# Patient Record
Sex: Female | Born: 1955 | Race: White | Hispanic: No | Marital: Married | Smoking: Never smoker
Health system: Southern US, Community
[De-identification: ages and names within clinical notes are randomized; demographics above are authoritative.]

## PROBLEM LIST (undated history)

## (undated) DIAGNOSIS — I2510 Atherosclerotic heart disease of native coronary artery without angina pectoris: Secondary | ICD-10-CM

## (undated) DIAGNOSIS — Z79890 Hormone replacement therapy (postmenopausal): Secondary | ICD-10-CM

## (undated) DIAGNOSIS — E7800 Pure hypercholesterolemia, unspecified: Secondary | ICD-10-CM

## (undated) DIAGNOSIS — I255 Ischemic cardiomyopathy: Secondary | ICD-10-CM

## (undated) DIAGNOSIS — I10 Essential (primary) hypertension: Secondary | ICD-10-CM

## (undated) DIAGNOSIS — R569 Unspecified convulsions: Secondary | ICD-10-CM

## (undated) DIAGNOSIS — F419 Anxiety disorder, unspecified: Secondary | ICD-10-CM

## (undated) DIAGNOSIS — G454 Transient global amnesia: Secondary | ICD-10-CM

## (undated) DIAGNOSIS — I214 Non-ST elevation (NSTEMI) myocardial infarction: Secondary | ICD-10-CM

## (undated) DIAGNOSIS — N951 Menopausal and female climacteric states: Secondary | ICD-10-CM

## (undated) DIAGNOSIS — K219 Gastro-esophageal reflux disease without esophagitis: Secondary | ICD-10-CM

## (undated) DIAGNOSIS — E669 Obesity, unspecified: Secondary | ICD-10-CM

## (undated) DIAGNOSIS — E785 Hyperlipidemia, unspecified: Secondary | ICD-10-CM

## (undated) HISTORY — DX: Atherosclerotic heart disease of native coronary artery without angina pectoris: I25.10

## (undated) HISTORY — DX: Hyperlipidemia, unspecified: E78.5

## (undated) HISTORY — DX: Hormone replacement therapy: Z79.890

## (undated) HISTORY — DX: Essential (primary) hypertension: I10

## (undated) HISTORY — DX: Anxiety disorder, unspecified: F41.9

## (undated) HISTORY — DX: Ischemic cardiomyopathy: I25.5

## (undated) HISTORY — DX: Gastro-esophageal reflux disease without esophagitis: K21.9

## (undated) HISTORY — DX: Menopausal and female climacteric states: N95.1

## (undated) HISTORY — DX: Unspecified convulsions: R56.9

---

## 1998-11-01 ENCOUNTER — Ambulatory Visit (HOSPITAL_COMMUNITY): Admission: RE | Admit: 1998-11-01 | Discharge: 1998-11-01 | Payer: Self-pay | Admitting: Internal Medicine

## 2014-09-25 ENCOUNTER — Emergency Department
Admission: EM | Admit: 2014-09-25 | Discharge: 2014-09-25 | Disposition: A | Discharge status: Home / Self Care | Payer: BLUE CROSS/BLUE SHIELD | Source: Home / Self Care | Attending: Family Medicine | Admitting: Family Medicine

## 2014-09-25 ENCOUNTER — Encounter: Payer: Self-pay | Admitting: Emergency Medicine

## 2014-09-25 DIAGNOSIS — J209 Acute bronchitis, unspecified: Secondary | ICD-10-CM

## 2014-09-25 HISTORY — DX: Essential (primary) hypertension: I10

## 2014-09-25 MED ORDER — CLARITHROMYCIN 250 MG PO TABS: ORAL_TABLET | ORAL | Status: DC

## 2014-09-25 MED ORDER — BENZONATATE 200 MG PO CAPS: 200.0000 mg | ORAL_CAPSULE | Freq: Every day | ORAL | Status: DC

## 2014-09-25 NOTE — Discharge Instructions (Signed)
Take plain Mucinex (1200 mg guaifenesin) twice daily for cough and congestion.  Increase fluid intake, rest. May use Afrin nasal spray (or generic oxymetazoline) twice daily for about 5 days.  Also recommend using saline nasal spray several times daily and saline nasal irrigation (AYR is a common brand).  Use fluticasone spray after using Afrin spray and saline irrigation. Try warm salt water gargles for sore throat.  Stop all antihistamines for now, and other non-prescription cough/cold preparations.  Follow-up with family doctor if not improving about 7 to10 days.

## 2014-09-25 NOTE — ED Provider Notes (Signed)
CSN: [id number]     Arrival date & time 09/25/14  1754 History   First MD Initiated Contact with Patient 09/25/14 1834     Chief Complaint  Patient presents with  . Cough      HPI Comments: Patient developed a mild cold-like illness about 2.5 weeks ago with sinus congestion, myalgias, and cough.  The cough has persisted and become worse over the past five days, especially at night.  The history is provided by the patient.    Past Medical History  Diagnosis Date  . Hypertension    History reviewed. No pertinent past surgical history. No family history on file. History  Substance Use Topics  . Smoking status: Never Smoker   . Smokeless tobacco: Not on file  . Alcohol Use: Yes   OB History    No data available     Review of Systems No sore throat + cough No pleuritic pain No wheezing + nasal congestion ? post-nasal drainage No sinus pain/pressure No itchy/red eyes No earache No hemoptysis No SOB No fever/chills No nausea No vomiting No abdominal pain No diarrhea No urinary symptoms No skin rash + fatigue No myalgias No headache Used OTC meds without relief  Allergies  Review of patient's allergies indicates no known allergies.  Home Medications   Prior to Admission medications   Medication Sig Start Date End Date Taking? Authorizing Provider  estradiol (VIVELLE-DOT) 0.0375 MG/24HR Place 1 patch onto the skin 2 (two) times a week.   Yes Historical Provider, MD  lisinopril (PRINIVIL,ZESTRIL) 20 MG tablet Take 20 mg by mouth daily.   Yes Historical Provider, MD  progesterone (PROMETRIUM) 100 MG capsule Take 100 mg by mouth daily.   Yes Historical Provider, MD  benzonatate (TESSALON) 200 MG capsule Take 1 capsule (200 mg total) by mouth at bedtime. Take as needed for cough 09/25/14   Lattie Haw, MD  clarithromycin (BIAXIN) 250 MG tablet Take one tab by mouth every 12 hours 09/25/14   Lattie Haw, MD   BP 156/84 mmHg  Pulse 90  Temp(Src) 98.3 F (36.8 C)  (Oral)  Ht  (1.575 m)  Wt 160 lb (72.576 kg)  BMI 29.26 kg/m2  SpO2 99% Physical Exam Nursing notes and Vital Signs reviewed. Appearance:  Patient appears healthy, stated age, and in no acute distress Eyes:  Pupils are equal, round, and reactive to light and accomodation.  Extraocular movement is intact.  Conjunctivae are not inflamed  Ears:  Canals normal.  Tympanic membranes normal.  Nose:  Mildly congested turbinates.  No sinus tenderness   Pharynx:  Normal Neck:  Supple.  No adenopathy Lungs:  Clear to auscultation.  Breath sounds are equal.  Heart:  Regular rate and rhythm without murmurs, rubs, or gallops.  Abdomen:  Nontender without masses or hepatosplenomegaly.  Bowel sounds are present.  No CVA or flank tenderness.  Extremities:  No edema.  No calf tenderness Skin:  No rash present.   ED Course  Procedures  none     MDM   1. Acute bronchitis, unspecified organism; ?pertussis    Begin Biaxin to cover atypicals.  Prescription written for Benzonatate [facility]) to take at bedtime for night-time cough.  Take plain Mucinex (1200 mg guaifenesin) twice daily for cough and congestion.  Increase fluid intake, rest. May use Afrin nasal spray (or generic oxymetazoline) twice daily for about 5 days.  Also recommend using saline nasal spray several times daily and saline nasal irrigation (AYR is a common  brand).  Use fluticasone spray after using Afrin spray and saline irrigation. Try warm salt water gargles for sore throat.  Stop all antihistamines for now, and other non-prescription cough/cold preparations.  Follow-up with family doctor if not improving about 7 to10 days.     Lattie HawStephen A Beese, MD 09/27/14 [phone]

## 2014-09-25 NOTE — ED Notes (Signed)
Productive cough, green mucus, congestion, body aches x 2 weeks

## 2014-11-17 DIAGNOSIS — N951 Menopausal and female climacteric states: Secondary | ICD-10-CM | POA: Insufficient documentation

## 2014-11-17 DIAGNOSIS — J309 Allergic rhinitis, unspecified: Secondary | ICD-10-CM | POA: Insufficient documentation

## 2014-11-17 DIAGNOSIS — K219 Gastro-esophageal reflux disease without esophagitis: Secondary | ICD-10-CM | POA: Insufficient documentation

## 2014-11-17 HISTORY — DX: Menopausal and female climacteric states: N95.1

## 2014-11-17 HISTORY — DX: Gastro-esophageal reflux disease without esophagitis: K21.9

## 2016-07-03 DIAGNOSIS — Z79890 Hormone replacement therapy (postmenopausal): Secondary | ICD-10-CM | POA: Insufficient documentation

## 2016-07-03 HISTORY — DX: Hormone replacement therapy: Z79.890

## 2016-07-10 ENCOUNTER — Inpatient Hospital Stay (HOSPITAL_COMMUNITY)
Admission: EM | Admit: 2016-07-10 | Discharge: 2016-07-11 | DRG: 247 | Disposition: A | Discharge status: Home / Self Care | Payer: BLUE CROSS/BLUE SHIELD | Attending: Cardiology | Admitting: Cardiology

## 2016-07-10 ENCOUNTER — Encounter (HOSPITAL_COMMUNITY): Payer: Self-pay

## 2016-07-10 ENCOUNTER — Encounter (HOSPITAL_COMMUNITY)
Admission: EM | Disposition: A | Discharge status: Home / Self Care | Payer: Self-pay | Source: Home / Self Care | Attending: Cardiology

## 2016-07-10 ENCOUNTER — Emergency Department (HOSPITAL_COMMUNITY): Payer: BLUE CROSS/BLUE SHIELD

## 2016-07-10 DIAGNOSIS — N951 Menopausal and female climacteric states: Secondary | ICD-10-CM | POA: Diagnosis present

## 2016-07-10 DIAGNOSIS — Z8249 Family history of ischemic heart disease and other diseases of the circulatory system: Secondary | ICD-10-CM

## 2016-07-10 DIAGNOSIS — Z955 Presence of coronary angioplasty implant and graft: Secondary | ICD-10-CM

## 2016-07-10 DIAGNOSIS — I2510 Atherosclerotic heart disease of native coronary artery without angina pectoris: Secondary | ICD-10-CM

## 2016-07-10 DIAGNOSIS — Z6830 Body mass index (BMI) 30.0-30.9, adult: Secondary | ICD-10-CM | POA: Diagnosis not present

## 2016-07-10 DIAGNOSIS — E669 Obesity, unspecified: Secondary | ICD-10-CM | POA: Diagnosis present

## 2016-07-10 DIAGNOSIS — R079 Chest pain, unspecified: Secondary | ICD-10-CM | POA: Diagnosis present

## 2016-07-10 DIAGNOSIS — I10 Essential (primary) hypertension: Secondary | ICD-10-CM | POA: Diagnosis present

## 2016-07-10 DIAGNOSIS — I255 Ischemic cardiomyopathy: Secondary | ICD-10-CM | POA: Diagnosis not present

## 2016-07-10 DIAGNOSIS — I214 Non-ST elevation (NSTEMI) myocardial infarction: Principal | ICD-10-CM

## 2016-07-10 DIAGNOSIS — E785 Hyperlipidemia, unspecified: Secondary | ICD-10-CM

## 2016-07-10 HISTORY — DX: Pure hypercholesterolemia, unspecified: E78.00

## 2016-07-10 HISTORY — DX: Atherosclerotic heart disease of native coronary artery without angina pectoris: I25.10

## 2016-07-10 HISTORY — DX: Non-ST elevation (NSTEMI) myocardial infarction: I21.4

## 2016-07-10 HISTORY — DX: Obesity, unspecified: E66.9

## 2016-07-10 HISTORY — DX: Transient global amnesia: G45.4

## 2016-07-10 HISTORY — PX: CARDIAC CATHETERIZATION: SHX172

## 2016-07-10 HISTORY — PX: CORONARY ANGIOPLASTY WITH STENT PLACEMENT: SHX49

## 2016-07-10 HISTORY — DX: Gastro-esophageal reflux disease without esophagitis: K21.9

## 2016-07-10 LAB — CBC
HEMATOCRIT: 44.3 % (ref 36.0–46.0)
HEMOGLOBIN: 15.5 g/dL — AB (ref 12.0–15.0)
MCH: 32.3 pg (ref 26.0–34.0)
MCHC: 35 g/dL (ref 30.0–36.0)
MCV: 92.3 fL (ref 78.0–100.0)
Platelets: 241 10*3/uL (ref 150–400)
RBC: 4.8 MIL/uL (ref 3.87–5.11)
RDW: 12.9 % (ref 11.5–15.5)
WBC: 6 10*3/uL (ref 4.0–10.5)

## 2016-07-10 LAB — TSH: TSH: 2.008 u[IU]/mL (ref 0.350–4.500)

## 2016-07-10 LAB — COMPREHENSIVE METABOLIC PANEL
ALT: 35 U/L (ref 14–54)
AST: 35 U/L (ref 15–41)
Albumin: 4.2 g/dL (ref 3.5–5.0)
Alkaline Phosphatase: 64 U/L (ref 38–126)
Anion gap: 10 (ref 5–15)
BUN: 17 mg/dL (ref 6–20)
CALCIUM: 9.2 mg/dL (ref 8.9–10.3)
CO2: 20 mmol/L — ABNORMAL LOW (ref 22–32)
Chloride: 106 mmol/L (ref 101–111)
Creatinine, Ser: 1.11 mg/dL — ABNORMAL HIGH (ref 0.44–1.00)
GFR calc Af Amer: >60 mL/min (ref >60)
GFR calc non Af Amer: 52 mL/min — ABNORMAL LOW (ref >60)
GLUCOSE: 109 mg/dL — AB (ref 65–99)
Potassium: 4.2 mmol/L (ref 3.5–5.1)
Sodium: 136 mmol/L (ref 135–145)
TOTAL PROTEIN: 6.4 g/dL — AB (ref 6.5–8.1)
Total Bilirubin: 1 mg/dL (ref 0.3–1.2)

## 2016-07-10 LAB — POCT ACTIVATED CLOTTING TIME
Activated Clotting Time: 714 seconds
Activated Clotting Time: 466 seconds

## 2016-07-10 LAB — TROPONIN I
Troponin I: 1.56 ng/mL (ref <0.03)
Troponin I: 1.22 ng/mL (ref <0.03)

## 2016-07-10 LAB — PROTIME-INR
INR: 1.64
PROTHROMBIN TIME: 19.6 s — AB (ref 11.4–15.2)

## 2016-07-10 LAB — I-STAT TROPONIN, ED: Troponin i, poc: 0.49 ng/mL (ref 0.00–0.08)

## 2016-07-10 SURGERY — LEFT HEART CATH AND CORONARY ANGIOGRAPHY

## 2016-07-10 MED ORDER — BIVALIRUDIN BOLUS VIA INFUSION - CUPID
INTRAVENOUS | Status: DC | PRN
  Administered 2016-07-10: 55.125 mg via INTRAVENOUS

## 2016-07-10 MED ORDER — ASPIRIN EC 81 MG PO TBEC: 81.0000 mg | DELAYED_RELEASE_TABLET | Freq: Every day | ORAL | Status: DC

## 2016-07-10 MED ORDER — CARVEDILOL 3.125 MG PO TABS: 3.1250 mg | ORAL_TABLET | Freq: Two times a day (BID) | ORAL | Status: DC

## 2016-07-10 MED ORDER — MIDAZOLAM HCL 2 MG/2ML IJ SOLN
INTRAMUSCULAR | Status: AC
  Filled 2016-07-10: qty 2

## 2016-07-10 MED ORDER — SODIUM CHLORIDE 0.9% FLUSH: 3.0000 mL | Freq: Two times a day (BID) | INTRAVENOUS | Status: DC

## 2016-07-10 MED ORDER — TICAGRELOR 90 MG PO TABS
ORAL_TABLET | ORAL | Status: AC
  Filled 2016-07-10: qty 2

## 2016-07-10 MED ORDER — ONDANSETRON HCL 4 MG/2ML IJ SOLN: 4.0000 mg | Freq: Four times a day (QID) | INTRAMUSCULAR | Status: DC | PRN

## 2016-07-10 MED ORDER — PANTOPRAZOLE SODIUM 40 MG PO TBEC
40.0000 mg | DELAYED_RELEASE_TABLET | Freq: Every day | ORAL | Status: DC
  Administered 2016-07-10 – 2016-07-11 (×2): 40 mg via ORAL
  Filled 2016-07-10 (×2): qty 1

## 2016-07-10 MED ORDER — ONDANSETRON HCL 4 MG/2ML IJ SOLN: 4.0000 mg | Freq: Once | INTRAMUSCULAR | Status: DC

## 2016-07-10 MED ORDER — VERAPAMIL HCL 2.5 MG/ML IV SOLN
INTRAVENOUS | Status: DC | PRN
  Administered 2016-07-10: 10 mL via INTRA_ARTERIAL

## 2016-07-10 MED ORDER — ATORVASTATIN CALCIUM 80 MG PO TABS: 80.0000 mg | ORAL_TABLET | Freq: Every day | ORAL | Status: DC

## 2016-07-10 MED ORDER — NITROGLYCERIN 1 MG/10 ML FOR IR/CATH LAB
INTRA_ARTERIAL | Status: DC | PRN
  Administered 2016-07-10 (×4): 200 ug

## 2016-07-10 MED ORDER — SODIUM CHLORIDE 0.9 % IV SOLN
INTRAVENOUS | Status: DC | PRN
  Administered 2016-07-10: 1.75 mg/kg/h via INTRAVENOUS

## 2016-07-10 MED ORDER — LIDOCAINE HCL (PF) 1 % IJ SOLN
INTRAMUSCULAR | Status: AC
  Filled 2016-07-10: qty 30

## 2016-07-10 MED ORDER — PROGESTERONE MICRONIZED 100 MG PO CAPS
100.0000 mg | ORAL_CAPSULE | Freq: Every day | ORAL | Status: DC
  Administered 2016-07-11: 11:00:00 100 mg via ORAL
  Filled 2016-07-10: qty 1

## 2016-07-10 MED ORDER — HEART ATTACK BOUNCING BOOK
Freq: Once | Status: AC
  Administered 2016-07-10: 20:00:00
  Filled 2016-07-10: qty 1

## 2016-07-10 MED ORDER — NITROGLYCERIN 0.4 MG SL SUBL
SUBLINGUAL_TABLET | SUBLINGUAL | Status: AC
  Administered 2016-07-10: 0.4 mg
  Filled 2016-07-10: qty 1

## 2016-07-10 MED ORDER — TICAGRELOR 90 MG PO TABS
ORAL_TABLET | ORAL | Status: DC | PRN
  Administered 2016-07-10: 180 mg via ORAL

## 2016-07-10 MED ORDER — IOPAMIDOL (ISOVUE-370) INJECTION 76%
INTRAVENOUS | Status: AC
  Filled 2016-07-10: qty 100

## 2016-07-10 MED ORDER — CARVEDILOL 3.125 MG PO TABS
3.1250 mg | ORAL_TABLET | Freq: Two times a day (BID) | ORAL | Status: DC
  Administered 2016-07-10 – 2016-07-11 (×2): 3.125 mg via ORAL
  Filled 2016-07-10 (×2): qty 1

## 2016-07-10 MED ORDER — ANGIOPLASTY BOOK
Freq: Once | Status: AC
  Administered 2016-07-10: 20:00:00
  Filled 2016-07-10: qty 1

## 2016-07-10 MED ORDER — ATROPINE SULFATE 1 MG/10ML IJ SOSY
PREFILLED_SYRINGE | INTRAMUSCULAR | Status: AC
  Filled 2016-07-10: qty 10

## 2016-07-10 MED ORDER — BIVALIRUDIN 250 MG IV SOLR
INTRAVENOUS | Status: AC
  Filled 2016-07-10: qty 250

## 2016-07-10 MED ORDER — ASPIRIN EC 81 MG PO TBEC
81.0000 mg | DELAYED_RELEASE_TABLET | Freq: Every day | ORAL | Status: DC
  Administered 2016-07-11: 09:00:00 81 mg via ORAL
  Filled 2016-07-10: qty 1

## 2016-07-10 MED ORDER — ATORVASTATIN CALCIUM 80 MG PO TABS
80.0000 mg | ORAL_TABLET | Freq: Every day | ORAL | Status: DC
  Administered 2016-07-10: 80 mg via ORAL
  Filled 2016-07-10: qty 1

## 2016-07-10 MED ORDER — ACETAMINOPHEN 325 MG PO TABS: 650.0000 mg | ORAL_TABLET | ORAL | Status: DC | PRN

## 2016-07-10 MED ORDER — HEPARIN SODIUM (PORCINE) 1000 UNIT/ML IJ SOLN
INTRAMUSCULAR | Status: DC | PRN
  Administered 2016-07-10: 4000 [IU] via INTRAVENOUS

## 2016-07-10 MED ORDER — NITROGLYCERIN 0.4 MG SL SUBL: 0.4000 mg | SUBLINGUAL_TABLET | SUBLINGUAL | Status: DC | PRN

## 2016-07-10 MED ORDER — FENTANYL CITRATE (PF) 100 MCG/2ML IJ SOLN
INTRAMUSCULAR | Status: DC | PRN
  Administered 2016-07-10 (×4): 25 ug via INTRAVENOUS

## 2016-07-10 MED ORDER — ASPIRIN 300 MG RE SUPP: 300.0000 mg | RECTAL | Status: AC

## 2016-07-10 MED ORDER — TICAGRELOR 90 MG PO TABS
90.0000 mg | ORAL_TABLET | Freq: Two times a day (BID) | ORAL | Status: DC
  Administered 2016-07-11 (×2): 90 mg via ORAL
  Filled 2016-07-10 (×2): qty 1

## 2016-07-10 MED ORDER — HEPARIN (PORCINE) IN NACL 100-0.45 UNIT/ML-% IJ SOLN
800.0000 [IU]/h | INTRAMUSCULAR | Status: DC
  Administered 2016-07-10: 800 [IU]/h via INTRAVENOUS
  Filled 2016-07-10: qty 250

## 2016-07-10 MED ORDER — SODIUM CHLORIDE 0.9 % WEIGHT BASED INFUSION: 1.0000 mL/kg/h | INTRAVENOUS | Status: AC

## 2016-07-10 MED ORDER — HEPARIN BOLUS VIA INFUSION
4000.0000 [IU] | Freq: Once | INTRAVENOUS | Status: AC
  Administered 2016-07-10: 4000 [IU] via INTRAVENOUS
  Filled 2016-07-10: qty 4000

## 2016-07-10 MED ORDER — LISINOPRIL 10 MG PO TABS
20.0000 mg | ORAL_TABLET | Freq: Every day | ORAL | Status: DC
  Administered 2016-07-11: 09:00:00 20 mg via ORAL
  Filled 2016-07-10: qty 2

## 2016-07-10 MED ORDER — SODIUM CHLORIDE 0.9 % IV SOLN: 250.0000 mL | INTRAVENOUS | Status: DC | PRN

## 2016-07-10 MED ORDER — VERAPAMIL HCL 2.5 MG/ML IV SOLN
INTRAVENOUS | Status: AC
  Filled 2016-07-10: qty 2

## 2016-07-10 MED ORDER — HYDRALAZINE HCL 20 MG/ML IJ SOLN
10.0000 mg | INTRAMUSCULAR | Status: DC | PRN
  Administered 2016-07-10: 20:00:00 10 mg via INTRAVENOUS
  Filled 2016-07-10: qty 1

## 2016-07-10 MED ORDER — HEPARIN (PORCINE) IN NACL 100-0.45 UNIT/ML-% IJ SOLN: 14.0000 [IU]/kg/h | Freq: Once | INTRAMUSCULAR | Status: DC

## 2016-07-10 MED ORDER — HEPARIN SODIUM (PORCINE) 5000 UNIT/ML IJ SOLN: 4000.0000 [IU] | Freq: Once | INTRAMUSCULAR | Status: DC

## 2016-07-10 MED ORDER — MIDAZOLAM HCL 2 MG/2ML IJ SOLN
INTRAMUSCULAR | Status: DC | PRN
  Administered 2016-07-10 (×2): 1 mg via INTRAVENOUS
  Administered 2016-07-10: 2 mg via INTRAVENOUS
  Administered 2016-07-10: 1 mg via INTRAVENOUS

## 2016-07-10 MED ORDER — HEPARIN SODIUM (PORCINE) 1000 UNIT/ML IJ SOLN
INTRAMUSCULAR | Status: AC
  Filled 2016-07-10: qty 1

## 2016-07-10 MED ORDER — VENLAFAXINE HCL ER 75 MG PO CP24
75.0000 mg | ORAL_CAPSULE | Freq: Every day | ORAL | Status: DC
  Administered 2016-07-11: 11:00:00 75 mg via ORAL
  Filled 2016-07-10: qty 1

## 2016-07-10 MED ORDER — LIDOCAINE HCL (PF) 1 % IJ SOLN
INTRAMUSCULAR | Status: DC | PRN
  Administered 2016-07-10: 2 mL

## 2016-07-10 MED ORDER — NITROGLYCERIN 1 MG/10 ML FOR IR/CATH LAB
INTRA_ARTERIAL | Status: AC
  Filled 2016-07-10: qty 10

## 2016-07-10 MED ORDER — ASPIRIN 81 MG PO CHEW: 324.0000 mg | CHEWABLE_TABLET | ORAL | Status: AC

## 2016-07-10 MED ORDER — FENTANYL CITRATE (PF) 100 MCG/2ML IJ SOLN
INTRAMUSCULAR | Status: AC
  Filled 2016-07-10: qty 2

## 2016-07-10 MED ORDER — ASPIRIN 81 MG PO CHEW: 81.0000 mg | CHEWABLE_TABLET | Freq: Every day | ORAL | Status: DC

## 2016-07-10 MED ORDER — HEPARIN (PORCINE) IN NACL 2-0.9 UNIT/ML-% IJ SOLN
INTRAMUSCULAR | Status: DC | PRN
  Administered 2016-07-10: 1500 mL

## 2016-07-10 MED ORDER — HEPARIN (PORCINE) IN NACL 2-0.9 UNIT/ML-% IJ SOLN
INTRAMUSCULAR | Status: AC
  Filled 2016-07-10: qty 1000

## 2016-07-10 MED ORDER — MORPHINE SULFATE (PF) 4 MG/ML IV SOLN: 4.0000 mg | Freq: Once | INTRAVENOUS | Status: DC

## 2016-07-10 MED ORDER — SODIUM CHLORIDE 0.9% FLUSH: 3.0000 mL | INTRAVENOUS | Status: DC | PRN

## 2016-07-10 MED ORDER — SODIUM CHLORIDE 0.9% FLUSH
3.0000 mL | Freq: Two times a day (BID) | INTRAVENOUS | Status: DC
  Administered 2016-07-10 – 2016-07-11 (×2): 3 mL via INTRAVENOUS

## 2016-07-10 MED ORDER — ONDANSETRON HCL 4 MG/2ML IJ SOLN
4.0000 mg | Freq: Four times a day (QID) | INTRAMUSCULAR | Status: DC | PRN
  Administered 2016-07-10: 4 mg via INTRAVENOUS
  Filled 2016-07-10: qty 2

## 2016-07-10 SURGICAL SUPPLY — 26 items
BALLN EMERGE MR 2.25X20 (BALLOONS) ×3
BALLN ~~LOC~~ EMERGE MR 3.0X8 (BALLOONS) ×3
BALLN ~~LOC~~ TREK RX 2.75X15 (BALLOONS) ×3
BALLOON EMERGE MR 2.25X20 (BALLOONS) ×1 IMPLANT
BALLOON ~~LOC~~ EMERGE MR 3.0X8 (BALLOONS) ×1 IMPLANT
BALLOON ~~LOC~~ TREK RX 2.75X15 (BALLOONS) ×1 IMPLANT
CATH 5FR JL3.5 JR4 ANG PIG MP (CATHETERS) ×3 IMPLANT
CATH INFINITI 5 FR 3DRC (CATHETERS) ×3 IMPLANT
CATH VISTA GUIDE 6FR JR4 (CATHETERS) ×3 IMPLANT
DEVICE RAD COMP TR BAND LRG (VASCULAR PRODUCTS) ×3 IMPLANT
GLIDESHEATH SLEND SS 6F .021 (SHEATH) ×3 IMPLANT
GUIDE CATH RUNWAY 6FR CLS3.5 (CATHETERS) ×3 IMPLANT
KIT ENCORE 26 ADVANTAGE (KITS) ×3 IMPLANT
KIT HEART LEFT (KITS) ×3 IMPLANT
PACK CARDIAC CATHETERIZATION (CUSTOM PROCEDURE TRAY) ×3 IMPLANT
SHEATH PINNACLE 6F 10CM (SHEATH) ×3 IMPLANT
STENT PROMUS PREM MR 2.5X32 (Permanent Stent) ×3 IMPLANT
STENT PROMUS PREM MR 2.5X38 (Permanent Stent) ×3 IMPLANT
STENT PROMUS PREM MR 2.75X12 (Permanent Stent) ×3 IMPLANT
SYR MEDRAD MARK V 150ML (SYRINGE) ×3 IMPLANT
TRANSDUCER W/STOPCOCK (MISCELLANEOUS) ×3 IMPLANT
TUBING CIL FLEX 10 FLL-RA (TUBING) ×3 IMPLANT
VALVE GUARDIAN II ~~LOC~~ HEMO (MISCELLANEOUS) ×3 IMPLANT
WIRE ASAHI PROWATER 180CM (WIRE) ×6 IMPLANT
WIRE EMERALD 3MM-J .035X150CM (WIRE) ×3 IMPLANT
WIRE HI TORQ VERSACORE-J 145CM (WIRE) ×3 IMPLANT

## 2016-07-10 NOTE — ED Notes (Signed)
Pt now states she is pain free.

## 2016-07-10 NOTE — Interval H&P Note (Signed)
Cath Lab Visit (complete for each Cath Lab visit)  Clinical Evaluation Leading to the Procedure:   ACS: Yes.    Non-ACS:    Anginal Classification: CCS IV  Anti-ischemic medical therapy: Minimal Therapy (1 class of medications)  Non-Invasive Test Results: No non-invasive testing performed  Prior CABG: No previous CABG      History and Physical Interval Note:  07/10/2016 2:26 PM  Amanda Dawson  has presented today for surgery, with the diagnosis of positive trip  The various methods of treatment have been discussed with the patient and family. After consideration of risks, benefits and other options for treatment, the patient has consented to  Procedure(s): Left Heart Cath and Coronary Angiography (N/A) as a surgical intervention .  The patient's history has been reviewed, patient examined, no change in status, stable for surgery.  I have reviewed the patient's chart and labs.  Questions were answered to the patient's satisfaction.     Lance MussJayadeep George Haggart

## 2016-07-10 NOTE — ED Triage Notes (Signed)
Pt arrives [facility] from work where she had onset of chest pain while walking. CO SHOB and clammy but non radiating central chest pain that made her sit down. Chest pain now resolved. HX of HTN. 324 mg Aspirin by EMS PTA.

## 2016-07-10 NOTE — ED Notes (Signed)
Placed patient into a gown on the monitor did ekg shown to Dr Deretha EmoryZackowski

## 2016-07-10 NOTE — ED Notes (Signed)
Requested tio stop heparin before going to cath lab.

## 2016-07-10 NOTE — H&P (Signed)
Patient ID: Amanda Dawson MRN: [id number], DOB/AGE: [birth year]   Admit date: 07/10/2016   Primary Physician: [name]., MD Primary Cardiologist: New - Dr. Delton See Reason for admission: chest pain   Pt. Profile:  Amanda Dawson is a 60 y.o. female with a history of obesity, HTN and no prior cardiac history who presented to [facility] today with chest pain.  She was in her usual state of health until this morning while at work when she had sudden onset of chest pain and shortness of breath and diaphoresis that lasted minutes. She went to see their on-call physician's assistant and EMS was called. She was walking when the chest pain occurred and she sat down due to the pain. It did resolve on its own. When she got to the ER she did have return of the chest pain which resolved completely with one sublingual nitroglycerin. She is now chest pain-free.  She only takes lisinopril for hypertension as well as progesterone, Effexor and estradiol for hot flashes. She has been slowly tapering down on estrogen. She recently had some vaginal bleeding that endometrial biopsy was fortunately benign.  She has no history of diabetes or hyperlipidemia and follows with a PCP regularly. No family history of heart disease. She is a never smoker. She does not formally exercise but is very active around the house gardening ect. No decrease exercise tolerance.   Problem List  Past Medical History:  Diagnosis Date  . Hypertension   . Obesity     History reviewed. No pertinent surgical history.   Allergies  No Known Allergies   Home Medications  Prior to Admission medications   Medication Sig Start Date End Date Taking? Authorizing Provider  calcium citrate-vitamin D (CITRACAL+D) 315-200 MG-UNIT tablet Take 1 tablet by mouth daily.   Yes Historical Provider, MD  Cholecalciferol (VITAMIN D3) 5000 units TABS Take 1 capsule by mouth daily.   Yes Historical Provider, MD  estradiol  (CLIMARA - DOSED IN MG/24 HR) 0.1 mg/24hr patch Place 1 patch onto the skin once a week. 11/10/15  Yes Historical Provider, MD  lisinopril (PRINIVIL,ZESTRIL) 20 MG tablet Take 20 mg by mouth daily.   Yes Historical Provider, MD  naproxen sodium (ANAPROX) 220 MG tablet Take 220 mg by mouth 2 (two) times daily as needed (pain).   Yes Historical Provider, MD  omeprazole (PRILOSEC) 20 MG capsule Take 20 mg by mouth daily. 12/24/15  Yes Historical Provider, MD  progesterone (PROMETRIUM) 100 MG capsule Take 100 mg by mouth daily.   Yes Historical Provider, MD  venlafaxine XR (EFFEXOR-XR) 75 MG 24 hr capsule Take 75 mg by mouth daily. 02/12/15  Yes Historical Provider, MD  benzonatate (TESSALON) 200 MG capsule Take 1 capsule (200 mg total) by mouth at bedtime. Take as needed for cough Patient not taking: Reported on 07/10/2016 09/25/14   Lattie Haw, MD  clarithromycin Quita Skye) 250 MG tablet Take one tab by mouth every 12 hours Patient not taking: Reported on 07/10/2016 09/25/14   Lattie Haw, MD    Family History  Family History  Problem Relation Age of Onset  . Hypertension Mother   . Heart disease Brother     Not sure what   Family Status  Relation Status  . Mother   . Brother      Social History  Social History   Social History  . Marital status: Married    Spouse name: N/A  . Number of children: N/A  .  Years of education: N/A   Occupational History  . Not on file.   Social History Main Topics  . Smoking status: Never Smoker  . Smokeless tobacco: Never Used  . Alcohol use Yes  . Drug use: No  . Sexual activity: Not on file   Other Topics Concern  . Not on file   Social History Narrative  . No narrative on file     Review of Systems General:  No chills, fever, night sweats or weight changes.  Cardiovascular:  +++chest pain, +++ dyspnea on exertion, No edema, orthopnea, palpitations, paroxysmal nocturnal dyspnea. Dermatological: No rash,  lesions/masses Respiratory: No cough, dyspnea Urologic: No hematuria, dysuria Abdominal:   No nausea, vomiting, diarrhea, bright red blood per rectum, melena, or hematemesis Neurologic:  No visual changes, wkns, changes in mental status. All other systems reviewed and are otherwise negative except as noted above.  Physical Exam  Blood pressure 142/75, pulse 100, temperature 98.3 F (36.8 C), temperature source Oral, resp. rate 19, height 5\' 1"  (1.549 m), weight 162 lb (73.5 kg), SpO2 96 %.  General: Pleasant, NAD, obese Psych: Normal affect. Neuro: Alert and oriented X 3. Moves all extremities spontaneously. HEENT: Normal  Neck: Supple without bruits or JVD. Lungs:  Resp regular and unlabored, CTA. Heart: RRR no s3, s4, or murmurs. Abdomen: Soft, non-tender, non-distended, BS + x 4.  Extremities: No clubbing, cyanosis or edema. DP/PT/Radials 2+ and equal bilaterally.  Labs  No results for input(s): CKTOTAL, CKMB, TROPONINI in the last 72 hours. Lab Results  Component Value Date   WBC 6.0 07/10/2016   HGB 15.5 (H) 07/10/2016   HCT 44.3 07/10/2016   MCV 92.3 07/10/2016   PLT 241 07/10/2016     Recent Labs Lab 07/10/16 1030  NA 136  K 4.2  CL 106  CO2 20*  BUN 17  CREATININE 1.11*  CALCIUM 9.2  PROT 6.4*  BILITOT 1.0  ALKPHOS 64  ALT 35  AST 35  GLUCOSE 109*   No results found for: CHOL, HDL, LDLCALC, TRIG No results found for: DDIMER   Radiology/Studies  Dg Chest 2 View  Result Date: 07/10/2016 CLINICAL DATA:  Chest pain. EXAM: CHEST  2 VIEW COMPARISON:  No recent prior . FINDINGS: Mediastinum and hilar structures normal. Low lung volumes. No pleural effusion or pneumothorax. Borderline cardiomegaly with normal pulmonary vascularity. No acute bony abnormality . IMPRESSION: Low lung volumes. Borderline cardiomegaly. No pulmonary venous congestion . Electronically Signed   By: Maisie Fus  Register   On: 07/10/2016 13:30   ECG  NSR with non specific anterolateral  ST/TW changes    ASSESSMENT AND PLAN  NSTEMI: mildly elevated POC troponin and ECG with non specific anterolateral ST/TW changes. Will plan for LHC today. Continue IV heparin. Now chest pain free. Will add BB and statin.   I have reviewed the risks, indications, and alternatives to cardiac catheterization and possible angioplasty/stenting with the patient. Risks include but are not limited to bleeding, infection, vascular injury, stroke, myocardial infection, arrhythmia, kidney injury, radiation-related injury in the case of prolonged fluoroscopy use, emergency cardiac surgery, and death. The patient understands the risks of serious complication is low (<1%).   HTN: continue home lisinopril. Will add Coreg 3.25mg  BID.   Obesity: Body mass index is 30.61 kg/m.  Signed, Cline Crock, PA-C 07/10/2016, 1:50 PM  Pager [phone]  The patient was seen, examined and discussed with Deborha Payment, PA-C and I agree with the above.   A very pleasant 60 year old  female with h/o obesity, who experienced typical chest pain while walking earlier today associated with SOB and diaphoresis, resolved at rest, ECG suspicious for anterior ischemia, the first troponin 0.49. No FH od early CAD, no prior smoking, no h/o DM. We will schedule a left cardiac cath today, crea is normal. She and her husband agree.Start aspirin, atorvastatin, carvedilol 3.125 mg po BID, lisinopril 2.5 mg po daily after the cath.   Tobias AlexanderKatarina Sriman Tally, MD  07/10/2016

## 2016-07-10 NOTE — ED Notes (Signed)
Pt returns from xray and ambulates to BR.

## 2016-07-10 NOTE — Progress Notes (Signed)
Site area: right groin  Site Prior to Removal:  Level 0  Pressure Applied For 20 MINUTES    Minutes Beginning at 20:25  Manual:   Yes.    Patient Status During Pull:  WNL  Post Pull Groin Site:  Level 0  Post Pull Instructions Given:  Yes.    Post Pull Pulses Present:  Yes.    Dressing Applied:  Yes.    Comments:

## 2016-07-10 NOTE — ED Notes (Signed)
Pt states chest pain 1/10 at left chest. PA aware

## 2016-07-10 NOTE — ED Notes (Signed)
Patient transported to X-ray 

## 2016-07-10 NOTE — Progress Notes (Signed)
TR BAND REMOVAL  LOCATION:    right radial  DEFLATED PER PROTOCOL:    Yes.    TIME BAND OFF / DRESSING APPLIED:    20:45   SITE UPON ARRIVAL:    Level 1; small palpable hematoma  SITE AFTER BAND REMOVAL:    Level 1; Bruise  CIRCULATION SENSATION AND MOVEMENT:    Within Normal Limits   Yes.    COMMENTS:   Post TR band instructions given. Pt tolerated well.

## 2016-07-10 NOTE — Progress Notes (Signed)
ANTICOAGULATION CONSULT NOTE - Initial Consult  Pharmacy Consult for heparin Indication: chest pain/ACS  No Known Allergies  Patient Measurements: Height: 5\' 1"  (154.9 cm) Weight: 162 lb (73.5 kg) IBW/kg (Calculated) : 47.8 Heparin Dosing Weight: 64kg  Vital Signs: Temp: 98.3 F (36.8 C) (10/23 1021) Temp Source: Oral (10/23 1021) BP: 167/81 (10/23 1100) Pulse Rate: 70 (10/23 1100)  Labs:  Recent Labs  07/10/16 1030  HGB 15.5*  HCT 44.3  PLT 241  CREATININE 1.11*    Estimated Creatinine Clearance: 48.2 mL/min (by C-G formula based on SCr of 1.11 mg/dL (H)).   Medical History: Past Medical History:  Diagnosis Date  . Hypertension     Medications:  Infusions:  . heparin      Assessment: Amanda Dawson yof presented to the ED with CP. Troponin found to be elevated and now starting IV heparin. Baseline CBC is WNL and she is not on anticoagulation PTA.   Goal of Therapy:  Heparin level 0.3-0.7 units/ml Monitor platelets by anticoagulation protocol: Yes   Plan:  - Heparin bolus 4000 units IV x 1 - Heparin gtt 800 units/hr - Check a 6 hr heparin level - Daily heparin level and CBC  Chrysta Fulcher, Drake Leachachel Lynn 07/10/2016,11:35 AM

## 2016-07-10 NOTE — ED Provider Notes (Signed)
[facility]-EMERGENCY DEPT Provider Note   CSN: [id number] Arrival date & time: 07/10/16  1018     History   Chief Complaint Chief Complaint  Patient presents with  . Chest Pain    HPI Amanda Dawson is a 60 y.o. female with history of hypertension who presents with sudden onset chest pressure, shortness of breath and diaphoresis that began this morning while patient was walking at work. Patient denies any radiation or pleuritic symptoms. Patient denies any nausea, vomiting, abdominal pain. Patient does not have any diabetes, history of stroke, high cholesterol, smoking. Patient was given 4 aspirin by EMS en route. Patient has been seeing her OB/GYN for hormone replacement. Patient has an estrogen patch. Patient has been having some vaginal bleeding, but recently had a negative ultrasound. Patient has no 1st degree family history of cardiac problems. Patient denies any recent long trips, surgery, cancer, new leg pain or swelling.Patient rates her pain as a 1/10.  HPI  Past Medical History:  Diagnosis Date  . Hypertension   . Obesity     Patient Active Problem List   Diagnosis Date Noted  . NSTEMI (non-ST elevated myocardial infarction) (HCC) 07/10/2016  . Hypertension     History reviewed. No pertinent surgical history.  OB History    Gravida Para Term Preterm AB Living   1 1           SAB TAB Ectopic Multiple Live Births                   Home Medications    Prior to Admission medications   Medication Sig Start Date End Date Taking? Authorizing Provider  calcium citrate-vitamin D (CITRACAL+D) 315-200 MG-UNIT tablet Take 1 tablet by mouth daily.   Yes Historical Provider, MD  Cholecalciferol (VITAMIN D3) 5000 units TABS Take 1 capsule by mouth daily.   Yes Historical Provider, MD  estradiol (CLIMARA - DOSED IN MG/24 HR) 0.1 mg/24hr patch Place 1 patch onto the skin once a week. 11/10/15  Yes Historical Provider, MD  lisinopril (PRINIVIL,ZESTRIL) 20 MG tablet Take 20 mg by  mouth daily.   Yes Historical Provider, MD  naproxen sodium (ANAPROX) 220 MG tablet Take 220 mg by mouth 2 (two) times daily as needed (pain).   Yes Historical Provider, MD  omeprazole (PRILOSEC) 20 MG capsule Take 20 mg by mouth daily. 12/24/15  Yes Historical Provider, MD  progesterone (PROMETRIUM) 100 MG capsule Take 100 mg by mouth daily.   Yes Historical Provider, MD  venlafaxine XR (EFFEXOR-XR) 75 MG 24 hr capsule Take 75 mg by mouth daily. 02/12/15  Yes Historical Provider, MD  benzonatate (TESSALON) 200 MG capsule Take 1 capsule (200 mg total) by mouth at bedtime. Take as needed for cough Patient not taking: Reported on 07/10/2016 09/25/14   Lattie HawStephen A Beese, MD  clarithromycin [place](BIAXIN) 250 MG tablet Take one tab by mouth every 12 hours Patient not taking: Reported on 07/10/2016 09/25/14   Lattie HawStephen A Beese, MD    Family History Family History  Problem Relation Age of Onset  . Hypertension Mother   . Heart disease Brother     Not sure what    Social History Social History  Substance Use Topics  . Smoking status: Never Smoker  . Smokeless tobacco: Never Used  . Alcohol use Yes     Allergies   Review of patient's allergies indicates no known allergies.   Review of Systems Review of Systems  Constitutional: Positive for diaphoresis. Negative for chills  and fever.  HENT: Negative for facial swelling and sore throat.   Respiratory: Positive for shortness of breath.   Cardiovascular: Positive for chest pain.  Gastrointestinal: Negative for abdominal pain, nausea and vomiting.  Genitourinary: Negative for dysuria.  Musculoskeletal: Negative for back pain.  Skin: Negative for rash and wound.  Neurological: Negative for headaches.  Psychiatric/Behavioral: The patient is not nervous/anxious.      Physical Exam Updated Vital Signs BP 165/95   Pulse 78   Temp 98.3 F (36.8 C) (Oral)   Resp 21   Ht 5\' 1"  (1.549 m)   Wt 73.5 kg   SpO2 100%   BMI 30.61 kg/m   Physical Exam    Constitutional: She appears well-developed and well-nourished. No distress.  HENT:  Head: Normocephalic and atraumatic.  Mouth/Throat: Oropharynx is clear and moist. No oropharyngeal exudate.  Eyes: Conjunctivae are normal. Pupils are equal, round, and reactive to light. Right eye exhibits no discharge. Left eye exhibits no discharge. No scleral icterus.  Neck: Normal range of motion. Neck supple. No thyromegaly present.  Cardiovascular: Normal rate, regular rhythm, normal heart sounds and intact distal pulses.  Exam reveals no gallop and no friction rub.   No murmur heard. Pulmonary/Chest: Effort normal and breath sounds normal. No stridor. No respiratory distress. She has no wheezes. She has no rales.  Abdominal: Soft. Bowel sounds are normal. She exhibits no distension. There is no tenderness. There is no rebound and no guarding.  Musculoskeletal: She exhibits no edema.  No calf TTP bilaterally  Lymphadenopathy:    She has no cervical adenopathy.  Neurological: She is alert. Coordination normal.  Skin: Skin is warm and dry. No rash noted. She is not diaphoretic. No pallor.  Psychiatric: She has a normal mood and affect.  Nursing note and vitals reviewed.    ED Treatments / Results  Labs (all labs ordered are listed, but only abnormal results are displayed) Labs Reviewed  CBC - Abnormal; Notable for the following:       Result Value   Hemoglobin 15.5 (*)    All other components within normal limits  COMPREHENSIVE METABOLIC PANEL - Abnormal; Notable for the following:    CO2 20 (*)    Glucose, Bld 109 (*)    Creatinine, Ser 1.11 (*)    Total Protein 6.4 (*)    GFR calc non Af Amer 52 (*)    All other components within normal limits  I-STAT TROPOININ, ED - Abnormal; Notable for the following:    Troponin i, poc 0.49 (*)    All other components within normal limits  HEPARIN LEVEL (UNFRACTIONATED)  PROTIME-INR  I-STAT TROPOININ, ED    EKG  EKG  Interpretation  Date/Time:  Monday July 10 2016 10:19:42 EDT Ventricular Rate:  69 PR Interval:    QRS Duration: 98 QT Interval:  392 QTC Calculation: 420 R Axis:   35 Text Interpretation:  Sinus rhythm Abnormal T, consider ischemia, lateral leads No previous ECGs available Confirmed by ZACKOWSKI  MD, SCOTT [phone]) on 07/10/2016 10:30:11 AM       Radiology Dg Chest 2 View  Result Date: 07/10/2016 CLINICAL DATA:  Chest pain. EXAM: CHEST  2 VIEW COMPARISON:  No recent prior . FINDINGS: Mediastinum and hilar structures normal. Low lung volumes. No pleural effusion or pneumothorax. Borderline cardiomegaly with normal pulmonary vascularity. No acute bony abnormality . IMPRESSION: Low lung volumes. Borderline cardiomegaly. No pulmonary venous congestion . Electronically Signed   By: Maisie Fus  Register  On: 07/10/2016 13:30    Procedures Procedures (including critical care time)  CRITICAL CARE Performed by: Emi Holes   Total critical care time: 30 minutes  Critical care time was exclusive of separately billable procedures and treating other patients.  Critical care was necessary to treat or prevent imminent or life-threatening deterioration.  Critical care was time spent personally by me on the following activities: development of treatment plan with patient and/or surrogate as well as nursing, discussions with consultants, evaluation of patient's response to treatment, examination of patient, obtaining history from patient or surrogate, ordering and performing treatments and interventions, ordering and review of laboratory studies, ordering and review of radiographic studies, pulse oximetry and re-evaluation of patient's condition.   Medications Ordered in ED Medications  heparin ADULT infusion 100 units/mL (25000 units/263mL sodium chloride 0.45%) (0 Units/hr Intravenous Stopped 07/10/16 1410)  aspirin EC tablet 81 mg ( Oral Automatically Held 07/18/16 1000)  atorvastatin  (LIPITOR) tablet 80 mg ( Oral Automatically Held 07/18/16 1800)  carvedilol (COREG) tablet 3.125 mg ( Oral Automatically Held 07/18/16 1700)  fentaNYL (SUBLIMAZE) injection (25 mcg Intravenous Given 07/10/16 1507)  midazolam (VERSED) injection (1 mg Intravenous Given 07/10/16 1508)  heparin injection (4,000 Units Intravenous Given 07/10/16 1448)  lidocaine (PF) (XYLOCAINE) 1 % injection (2 mLs  Given 07/10/16 1448)  Radial Cocktail/Verapamil only (10 mLs Intra-arterial Given 07/10/16 1448)  bivalirudin (ANGIOMAX) BOLUS via infusion (55.125 mg Intravenous Given 07/10/16 1512)  bivalirudin (ANGIOMAX) 250 mg in sodium chloride 0.9 % 50 mL (5 mg/mL) infusion (1.75 mg/kg/hr  73.5 kg Intravenous New Bag/Given 07/10/16 1514)  nitroGLYCERIN (NITROSTAT) 0.4 MG SL tablet (0.4 mg  Given 07/10/16 1123)  heparin bolus via infusion 4,000 Units (4,000 Units Intravenous Bolus from Bag 07/10/16 1250)     Initial Impression / Assessment and Plan / ED Course  I have reviewed the triage vital signs and the nursing notes.  Pertinent labs & imaging results that were available during my care of the patient were reviewed by me and considered in my medical decision making (see chart for details).  Clinical Course   Pain resolved with nitroglycerin glycerin.  EKG shows NSR; abnormal T waves. CBC shows hemoglobin 15.5. CMP shows CO2 20, glucose 109, creatinine 1.11, protein 6.4. Troponin 0.49. CXR shows low lung volumes; borderline cardiomegaly; no pulmonary venous congestion. Patient with ACS syndrome. I consulted cardiology and patient is being taken to cardiac Cath Lab. Patient will be admitted to the hospital through cardiology. Heparin initiated in the ED. Patient also evaluated by Dr. Deretha Emory who guided the patient's management and agrees with plan. Patient vitals stable throughout ED course.  Final Clinical Impressions(s) / ED Diagnoses   Final diagnoses:  NSTEMI (non-ST elevated myocardial infarction)  [facility])    New Prescriptions Current Discharge Medication List       Emi Holes, PA-C 07/10/16 1519    Vanetta Mulders, MD 07/10/16 1606

## 2016-07-11 ENCOUNTER — Inpatient Hospital Stay (HOSPITAL_COMMUNITY): Payer: BLUE CROSS/BLUE SHIELD

## 2016-07-11 ENCOUNTER — Other Ambulatory Visit: Payer: Self-pay | Admitting: *Deleted

## 2016-07-11 ENCOUNTER — Encounter (HOSPITAL_COMMUNITY): Payer: Self-pay | Admitting: Interventional Cardiology

## 2016-07-11 DIAGNOSIS — I2510 Atherosclerotic heart disease of native coronary artery without angina pectoris: Secondary | ICD-10-CM

## 2016-07-11 DIAGNOSIS — I255 Ischemic cardiomyopathy: Secondary | ICD-10-CM

## 2016-07-11 DIAGNOSIS — Z955 Presence of coronary angioplasty implant and graft: Secondary | ICD-10-CM

## 2016-07-11 DIAGNOSIS — E785 Hyperlipidemia, unspecified: Secondary | ICD-10-CM

## 2016-07-11 HISTORY — DX: Hyperlipidemia, unspecified: E78.5

## 2016-07-11 HISTORY — DX: Atherosclerotic heart disease of native coronary artery without angina pectoris: I25.10

## 2016-07-11 LAB — COMPREHENSIVE METABOLIC PANEL
ALBUMIN: 4.1 g/dL (ref 3.5–5.0)
ALT: 35 U/L (ref 14–54)
ANION GAP: 10 (ref 5–15)
AST: 45 U/L — ABNORMAL HIGH (ref 15–41)
Alkaline Phosphatase: 65 U/L (ref 38–126)
BUN: 9 mg/dL (ref 6–20)
CO2: 26 mmol/L (ref 22–32)
Calcium: 9.2 mg/dL (ref 8.9–10.3)
Chloride: 100 mmol/L — ABNORMAL LOW (ref 101–111)
Creatinine, Ser: 0.99 mg/dL (ref 0.44–1.00)
GFR calc Af Amer: >60 mL/min (ref >60)
GFR calc non Af Amer: 60 mL/min — ABNORMAL LOW (ref >60)
GLUCOSE: 111 mg/dL — AB (ref 65–99)
POTASSIUM: 4.1 mmol/L (ref 3.5–5.1)
SODIUM: 136 mmol/L (ref 135–145)
Total Bilirubin: 1.8 mg/dL — ABNORMAL HIGH (ref 0.3–1.2)
Total Protein: 6.9 g/dL (ref 6.5–8.1)

## 2016-07-11 LAB — LIPID PANEL
Cholesterol: 190 mg/dL (ref 0–200)
HDL: 38 mg/dL — ABNORMAL LOW (ref >40)
LDL CALC: 105 mg/dL — AB (ref 0–99)
Total CHOL/HDL Ratio: 5 RATIO
Triglycerides: 236 mg/dL — ABNORMAL HIGH (ref <150)
VLDL: 47 mg/dL — ABNORMAL HIGH (ref 0–40)

## 2016-07-11 LAB — CBC
HCT: 46.3 % — ABNORMAL HIGH (ref 36.0–46.0)
Hemoglobin: 15.9 g/dL — ABNORMAL HIGH (ref 12.0–15.0)
MCH: 32.4 pg (ref 26.0–34.0)
MCHC: 34.3 g/dL (ref 30.0–36.0)
MCV: 94.5 fL (ref 78.0–100.0)
PLATELETS: 292 10*3/uL (ref 150–400)
RBC: 4.9 MIL/uL (ref 3.87–5.11)
RDW: 13.5 % (ref 11.5–15.5)
WBC: 10.8 10*3/uL — AB (ref 4.0–10.5)

## 2016-07-11 LAB — TROPONIN I: Troponin I: 2.08 ng/mL (ref <0.03)

## 2016-07-11 LAB — PROTIME-INR
INR: 0.99
Prothrombin Time: 13.1 seconds (ref 11.4–15.2)

## 2016-07-11 LAB — HEMOGLOBIN A1C
Hgb A1c MFr Bld: 5 % (ref 4.8–5.6)
Mean Plasma Glucose: 97 mg/dL

## 2016-07-11 LAB — ECHOCARDIOGRAM COMPLETE
Height: 61 in
Weight: 2585.55 oz

## 2016-07-11 MED ORDER — PERFLUTREN LIPID MICROSPHERE
INTRAVENOUS | Status: AC
  Filled 2016-07-11: qty 10

## 2016-07-11 MED ORDER — ASPIRIN 81 MG PO TBEC: 81.0000 mg | DELAYED_RELEASE_TABLET | Freq: Every day | ORAL | Status: DC

## 2016-07-11 MED ORDER — AMBULATORY NON FORMULARY MEDICATION: 81.0000 mg | Freq: Every day | Status: DC

## 2016-07-11 MED ORDER — NITROGLYCERIN 0.4 MG SL SUBL: 0.4000 mg | SUBLINGUAL_TABLET | SUBLINGUAL | 3 refills | Status: DC | PRN

## 2016-07-11 MED ORDER — PERFLUTREN LIPID MICROSPHERE
1.0000 mL | INTRAVENOUS | Status: AC | PRN
  Administered 2016-07-11: 13:00:00 4 mL via INTRAVENOUS
  Filled 2016-07-11: qty 10

## 2016-07-11 MED ORDER — ATORVASTATIN CALCIUM 80 MG PO TABS: 80.0000 mg | ORAL_TABLET | Freq: Every day | ORAL | 6 refills | Status: DC

## 2016-07-11 MED ORDER — CARVEDILOL 3.125 MG PO TABS: 3.1250 mg | ORAL_TABLET | Freq: Two times a day (BID) | ORAL | 6 refills | Status: DC

## 2016-07-11 MED ORDER — AMBULATORY NON FORMULARY MEDICATION: 90.0000 mg | Freq: Two times a day (BID) | Status: DC

## 2016-07-11 MED ORDER — TICAGRELOR 90 MG PO TABS: 90.0000 mg | ORAL_TABLET | Freq: Two times a day (BID) | ORAL | Status: DC

## 2016-07-11 NOTE — Progress Notes (Signed)
CARDIAC REHAB PHASE I   PRE:  Rate/Rhythm: 89 SR  BP:  Supine:   Sitting: 136/65  Standing:    SaO2:   MODE:  Ambulation: 800 ft   POST:  Rate/Rhythm: 99 SR  BP:  Supine:   Sitting: 153/71  Standing:    SaO2: 99%RA 0805-0920 Pt walked 800 ft on RA with steady gait. No CP. Tolerated well. MI education completed with pt and husband who voiced understanding. Stressed importance of brilinta with stent. Reviewed use of NTG with angina, MI restrictions, risk factors, ex ed, and heart healthy diet. Gave pt CHF booklet and reviewed zones especially when to call cardiologist. Discussed importance of daily weights, 2000 mg sodium restriction and gave low sodium handouts. Discussed CRP 2 and will refer to [place] program.   Luetta Nuttingharlene Anahid Eskelson, RN BSN  07/11/2016 9:14 AM

## 2016-07-11 NOTE — Discharge Summary (Signed)
Discharge Summary    Patient ID: Amanda Dawson,  MRN: [id number], DOB/AGE: 1956/01/31 60 y.o.  Admit date: 07/10/2016 Discharge date: 07/11/2016  Primary Care Provider: [name],[name] Primary Cardiologist: Dr. Delton See  Discharge Diagnoses    Principal Problem:   NSTEMI (non-ST elevated myocardial infarction) [facility]) Active Problems:   CAD (coronary artery disease), native coronary artery   Hypertension   Hyperlipidemia   Allergies No Known Allergies  Diagnostic Studies/Procedures    LHC: 07/10/16  Conclusion     The left ventricular ejection fraction is 35-45% by visual estimate.  There is mild to moderate left ventricular systolic dysfunction.  LV end diastolic pressure is normal.  There is no aortic valve stenosis.  Mid LAD lesion, 99 %stenosed. A STENT PROMUS PREM MR 2.5X38 drug eluting stent was successfully placed, postdilated to 2.8 mm.  Post intervention, there is a 0% residual stenosis.  1st Mrg lesion, 80 %stenosed. A STENT PROMUS PREM MR 2.75X12 drug eluting stent was successfully placed, postdilated to 3.0 mm.  Post intervention, there is a 0% residual stenosis.  Mid RCA lesion, 95 %stenosed. A STENT PROMUS PREM MR 2.5X32 drug eluting stent was successfully placed, postdilated to > 3 mm in diameter.  Post intervention, there is a 0% residual stenosis.   Continue dual antiplatelet therapy along with aggressive secondary prevention.  I stressed the importance of dual antiplatelet therapy.  She will need a repeat echo in 6- 8 weeks to see that her LVEF improved.  Add ACE-I and continue beta blocker for LV dysfunction.   Diagnostic Diagram     Post-Intervention Diagram      _____________   History of Present Illness     60 yo female with PMH of obseity, and HTN who presented to the [facility] ED with reports of chest pain that started while she was at work. Reports she had a sudden onset with associated dyspnea and diaphoresis that lasted a  couple of minutes. Reported she was walking when the chest pain occurred and she sat down due to the pain. It did resolve on its own She went to see her on call provider and EMS was called to transport to the ED for further evaluation. When she got to the ER she did have return of the chest pain which resolved completely with one sublingual nitroglycerin.    She only takes lisinopril for hypertension as well as progesterone, Effexor and estradiol for hot flashes. She has been slowly tapering down on estrogen. She recently had some vaginal bleeding that endometrial biopsy was fortunately benign.  She has no history of diabetes or hyperlipidemia and follows with a PCP regularly. No family history of heart disease. She is a never smoker. She does not formally exercise but is very active around the house gardening ect. No decrease exercise tolerance.  Hospital Course     Consultants: none  She was admitted and ruled in for a NSTEMI and underwent LHC with Dr. Eldridge Dace showing showed decreased LV ejection fraction of 35-45% on ventriculogram, she also had three-vessel disease with 99% stenosis in mid LAD that was stented, 80% stenosis of the first marginal branch that was stented, and 95% mid RCA lesion that was stented as well.  She decided to enroll in the Twilight study and will continue on ASA and Brilinta for at least one year. This admission she was also started on atorvastatin 80mg , low dose coreg and her home lisinopril was continued. Worked will with cardiac rehab. Labs  the following day showed stable Cr and Hgb. Both radial and groin sights were stable. Right radial with slight bruising, but no pain. Her follow up Echo showed EF of 35-40% with akinesis of the mid-apicalanteroseptal and apical myocardium, along with G1DD.    She was seen and assessed by Dr. Delton See on 10/24 and determined stable for discharge home. I have arranged for follow up in the office. She was given her Brilinta and ASA prior  to discharge per Twilight study. Patient does take home estradiol and progesterone, I have asked the patient to make a follow up appt with her OB/GYN to discuss reducing or stopping these given her recent findings of CAD. _____________  Discharge Vitals Blood pressure 137/61, pulse 72, temperature 97 F (36.1 C), temperature source Oral, resp. rate 14, height 5\' 1"  (1.549 m), weight 161 lb 9.6 oz (73.3 kg), SpO2 97 %.  Filed Weights   07/10/16 1023 07/11/16 0323  Weight: 162 lb (73.5 kg) 161 lb 9.6 oz (73.3 kg)    Labs & Radiologic Studies    CBC  Recent Labs  07/10/16 1030 07/11/16 0558  WBC 6.0 10.8*  HGB 15.5* 15.9*  HCT 44.3 46.3*  MCV 92.3 94.5  PLT 241 292   Basic Metabolic Panel  Recent Labs  07/10/16 1030 07/11/16 0558  NA 136 136  K 4.2 4.1  CL 106 100*  CO2 20* 26  GLUCOSE 109* 111*  BUN 17 9  CREATININE 1.11* 0.99  CALCIUM 9.2 9.2   Liver Function Tests  Recent Labs  07/10/16 1030 07/11/16 0558  AST 35 45*  ALT 35 35  ALKPHOS 64 65  BILITOT 1.0 1.8*  PROT 6.4* 6.9  ALBUMIN 4.2 4.1   No results for input(s): LIPASE, AMYLASE in the last 72 hours. Cardiac Enzymes  Recent Labs  07/10/16 1803 07/10/16 2308 07/11/16 0558  TROPONINI 1.56* 1.22* 2.08*   BNP Invalid input(s): POCBNP D-Dimer No results for input(s): DDIMER in the last 72 hours. Hemoglobin A1C  Recent Labs  07/10/16 1803  HGBA1C 5.0   Fasting Lipid Panel  Recent Labs  07/11/16 0558  CHOL 190  HDL 38*  LDLCALC 105*  TRIG 236*  CHOLHDL 5.0   Thyroid Function Tests  Recent Labs  07/10/16 1803  TSH 2.008   _____________  Dg Chest 2 View  Result Date: 07/10/2016 CLINICAL DATA:  Chest pain. EXAM: CHEST  2 VIEW COMPARISON:  No recent prior . FINDINGS: Mediastinum and hilar structures normal. Low lung volumes. No pleural effusion or pneumothorax. Borderline cardiomegaly with normal pulmonary vascularity. No acute bony abnormality . IMPRESSION: Low lung volumes.  Borderline cardiomegaly. No pulmonary venous congestion . Electronically Signed   By: Maisie Fus  Register   On: 07/10/2016 13:30   Disposition   Pt is being discharged home today in good condition.  Follow-up Plans & Appointments    Follow-up Information    Robbie Lis, PA-C Follow up on 07/18/2016.   Specialties:  Cardiology, Radiology Why:  9:30am for your hospital follow up. Please arrive 15 mins prior to be checked in. Contact information: [address] [place] [address] [phone]        OB/GYN .   Why:  Please make a follow up visit with your OB/GYN to discuss reducing or stopping your progesterone and estrogen therapy.          Discharge Instructions    Amb Referral to Cardiac Rehabilitation    Complete by:  As directed  Diagnosis:   NSTEMI Coronary Stents     Diet - low sodium heart healthy    Complete by:  As directed    Discharge instructions    Complete by:  As directed    Radial Site Care Refer to this sheet in the next few weeks. These instructions provide you with information on caring for yourself after your procedure. Your caregiver may also give you more specific instructions. Your treatment has been planned according to current medical practices, but problems sometimes occur. Call your caregiver if you have any problems or questions after your procedure. HOME CARE INSTRUCTIONS You may shower the day after the procedure.Remove the bandage (dressing) and gently wash the site with plain soap and water.Gently pat the site dry.  Do not apply powder or lotion to the site.  Do not submerge the affected site in water for 3 to 5 days.  Inspect the site at least twice daily.  Do not flex or bend the affected arm for 24 hours.  No lifting over 5 pounds (2.3 kg) for 5 days after your procedure.  Do not drive home if you are discharged the same day of the procedure. Have someone else drive you.  You may drive 24 hours after the procedure  unless otherwise instructed by your caregiver.  What to expect: Any bruising will usually fade within 1 to 2 weeks.  Blood that collects in the tissue (hematoma) may be painful to the touch. It should usually decrease in size and tenderness within 1 to 2 weeks.  SEEK IMMEDIATE MEDICAL CARE IF: You have unusual pain at the radial site.  You have redness, warmth, swelling, or pain at the radial site.  You have drainage (other than a small amount of blood on the dressing).  You have chills.  You have a fever or persistent symptoms for more than 72 hours.  You have a fever and your symptoms suddenly get worse.  Your arm becomes pale, cool, tingly, or numb.  You have heavy bleeding from the site. Hold pressure on the site.  Groin Site Care Refer to this sheet in the next few weeks. These instructions provide you with information on caring for yourself after your procedure. Your caregiver may also give you more specific instructions. Your treatment has been planned according to current medical practices, but problems sometimes occur. Call your caregiver if you have any problems or questions after your procedure. HOME CARE INSTRUCTIONS You may shower 24 hours after the procedure. Remove the bandage (dressing) and gently wash the site with plain soap and water. Gently pat the site dry.  Do not apply powder or lotion to the site.  Do not sit in a bathtub, swimming pool, or whirlpool for 5 to 7 days.  No bending, squatting, or lifting anything over 10 pounds (4.5 kg) as directed by your caregiver.  Inspect the site at least twice daily.  Do not drive home if you are discharged the same day of the procedure. Have someone else drive you.  You may drive 24 hours after the procedure unless otherwise instructed by your caregiver.  What to expect: Any bruising will usually fade within 1 to 2 weeks.  Blood that collects in the tissue (hematoma) may be painful to the touch. It should usually decrease in size  and tenderness within 1 to 2 weeks.  SEEK IMMEDIATE MEDICAL CARE IF: You have unusual pain at the groin site or down the affected leg.  You have redness, warmth, swelling, or  pain at the groin site.  You have drainage (other than a small amount of blood on the dressing).  You have chills.  You have a fever or persistent symptoms for more than 72 hours.  You have a fever and your symptoms suddenly get worse.  Your leg becomes pale, cool, tingly, or numb.  You have heavy bleeding from the site. Hold pressure on the site. .   Increase activity slowly    Complete by:  As directed       Discharge Medications   Current Discharge Medication List    START taking these medications   Details  aspirin EC 81 MG EC tablet Take 1 tablet (81 mg total) by mouth daily.    atorvastatin (LIPITOR) 80 MG tablet Take 1 tablet (80 mg total) by mouth daily at 6 PM. Qty: 30 tablet, Refills: 6    carvedilol (COREG) 3.125 MG tablet Take 1 tablet (3.125 mg total) by mouth 2 (two) times daily with a meal. Qty: 60 tablet, Refills: 6    nitroGLYCERIN (NITROSTAT) 0.4 MG SL tablet Place 1 tablet (0.4 mg total) under the tongue every 5 (five) minutes x 3 doses as needed for chest pain. Qty: 25 tablet, Refills: 3    ticagrelor (BRILINTA) 90 MG TABS tablet Take 1 tablet (90 mg total) by mouth 2 (two) times daily. Qty: 60 tablet      CONTINUE these medications which have NOT CHANGED   Details  calcium citrate-vitamin D (CITRACAL+D) 315-200 MG-UNIT tablet Take 1 tablet by mouth daily.    Cholecalciferol (VITAMIN D3) 5000 units TABS Take 1 capsule by mouth daily.    estradiol (CLIMARA - DOSED IN MG/24 HR) 0.1 mg/24hr patch Place 1 patch onto the skin once a week.    lisinopril (PRINIVIL,ZESTRIL) 20 MG tablet Take 20 mg by mouth daily.    omeprazole (PRILOSEC) 20 MG capsule Take 20 mg by mouth daily.    progesterone (PROMETRIUM) 100 MG capsule Take 100 mg by mouth daily.    venlafaxine XR (EFFEXOR-XR) 75  MG 24 hr capsule Take 75 mg by mouth daily.      STOP taking these medications     naproxen sodium (ANAPROX) 220 MG tablet      benzonatate (TESSALON) 200 MG capsule      clarithromycin (BIAXIN) 250 MG tablet          Aspirin prescribed at discharge?  Yes High Intensity Statin Prescribed? (Lipitor 40-80mg  or Crestor 20-40mg ): Yes Beta Blocker Prescribed? Yes For EF <40%, was ACEI/ARB Prescribed? Yes ADP Receptor Inhibitor Prescribed? (i.e. Plavix etc.-Includes Medically Managed Patients): Yes For EF <40%, Aldosterone Inhibitor Prescribed? No:  Was EF assessed during THIS hospitalization? Yes Was Cardiac Rehab II ordered? (Included Medically managed Patients): Yes   Outstanding Labs/Studies   Consider follow up BMET, CBC along with FLP and LFTs. Will need follow up echo in 6-8 weeks to reassess LV function.  Duration of Discharge Encounter   Greater than 30 minutes including physician time.  Signed, Laverda Page NP-C 07/11/2016, 3:17 PM

## 2016-07-11 NOTE — Progress Notes (Signed)
  Echocardiogram 2D Echocardiogram with Definity has been performed.  Leta JunglingCooper, Yaneth Fairbairn M 07/11/2016, 1:20 PM

## 2016-07-11 NOTE — Progress Notes (Signed)
Patient Name: Amanda GasserKaren S Dawson Date of Encounter: 07/11/2016  Primary Cardiologist: Dr. Memory DanceNelson  Hospital Problem List     Principal Problem:   NSTEMI (non-ST elevated myocardial infarction) [facility](HCC) Active Problems:   Hypertension     Subjective   Feeling well this morning. No cp or dyspnea.  Inpatient Medications    Scheduled Meds: . aspirin EC  81 mg Oral Daily  . atorvastatin  80 mg Oral q1800  . carvedilol  3.125 mg Oral BID WC  . lisinopril  20 mg Oral Daily  . pantoprazole  40 mg Oral Daily  . progesterone  100 mg Oral Daily  . sodium chloride flush  3 mL Intravenous Q12H  . sodium chloride flush  3 mL Intravenous Q12H  . ticagrelor  90 mg Oral BID  . venlafaxine XR  75 mg Oral Daily   Continuous Infusions: . heparin Stopped (07/10/16 1410)   PRN Meds: sodium chloride, sodium chloride, acetaminophen, hydrALAZINE, nitroGLYCERIN, ondansetron (ZOFRAN) IV, sodium chloride flush, sodium chloride flush   Vital Signs    Vitals:   07/10/16 2045 07/10/16 2050 07/10/16 2100 07/11/16 0323  BP: (!) 142/65 131/70 (!) 130/105 (!) 130/56  Pulse: 73 71 72 69  Resp: 17 17 (!) 23 20  Temp:    97.7 F (36.5 C)  TempSrc:    Oral  SpO2: 97% 96% 96% 100%  Weight:    161 lb 9.6 oz (73.3 kg)  Height:        Intake/Output Summary (Last 24 hours) at 07/11/16 0639 Last data filed at 07/11/16 0330  Gross per 24 hour  Intake           515.63 ml  Output             1200 ml  Net          -684.37 ml   Filed Weights   07/10/16 1023 07/11/16 0323  Weight: 162 lb (73.5 kg) 161 lb 9.6 oz (73.3 kg)    Physical Exam    GEN: Well nourished, well developed, in no acute distress.  HEENT: Grossly normal.  Neck: Supple, no JVD, carotid bruits, or masses. Cardiac: RRR, no murmurs, rubs, or gallops. No clubbing, cyanosis, edema.  Radials/DP/PT 2+ and equal bilaterally.  Respiratory:  Respirations regular and unlabored, clear to auscultation bilaterally. GI: Soft, nontender,  nondistended, BS + x 4. MS: no deformity or atrophy. Skin: warm and dry, no rash. Right radial site with bruising, no hematoma. Right femoral site without bruising or hematoma.  Neuro:  Strength and sensation are intact. Psych: AAOx3.  Normal affect.  Labs    CBC  Recent Labs  07/10/16 1030  WBC 6.0  HGB 15.5*  HCT 44.3  MCV 92.3  PLT 241   Basic Metabolic Panel  Recent Labs  07/10/16 1030  NA 136  K 4.2  CL 106  CO2 20*  GLUCOSE 109*  BUN 17  CREATININE 1.11*  CALCIUM 9.2   Liver Function Tests  Recent Labs  07/10/16 1030  AST 35  ALT 35  ALKPHOS 64  BILITOT 1.0  PROT 6.4*  ALBUMIN 4.2   No results for input(s): LIPASE, AMYLASE in the last 72 hours. Cardiac Enzymes  Recent Labs  07/10/16 1803 07/10/16 2308  TROPONINI 1.56* 1.22*   BNP Invalid input(s): POCBNP D-Dimer No results for input(s): DDIMER in the last 72 hours. Hemoglobin A1C  Recent Labs  07/10/16 1803  HGBA1C 5.0   Fasting Lipid Panel No results for  input(s): CHOL, HDL, LDLCALC, TRIG, CHOLHDL, LDLDIRECT in the last 72 hours. Thyroid Function Tests  Recent Labs  07/10/16 1803  TSH 2.008    Telemetry    SR - Personally Reviewed  ECG    SR with new QT prolongation - Personally Reviewed  Radiology    Dg Chest 2 View  Result Date: 07/10/2016 CLINICAL DATA:  Chest pain. EXAM: CHEST  2 VIEW COMPARISON:  No recent prior . FINDINGS: Mediastinum and hilar structures normal. Low lung volumes. No pleural effusion or pneumothorax. Borderline cardiomegaly with normal pulmonary vascularity. No acute bony abnormality . IMPRESSION: Low lung volumes. Borderline cardiomegaly. No pulmonary venous congestion . Electronically Signed   By: Maisie Fus  Register   On: 07/10/2016 13:30    Cardiac Studies   LHC: 07/10/16  Conclusion     The left ventricular ejection fraction is 35-45% by visual estimate.  There is mild to moderate left ventricular systolic dysfunction.  LV end  diastolic pressure is normal.  There is no aortic valve stenosis.  Mid LAD lesion, 99 %stenosed. A STENT PROMUS PREM MR 2.5X38 drug eluting stent was successfully placed, postdilated to 2.8 mm.  Post intervention, there is a 0% residual stenosis.  1st Mrg lesion, 80 %stenosed. A STENT PROMUS PREM MR 2.75X12 drug eluting stent was successfully placed, postdilated to 3.0 mm.  Post intervention, there is a 0% residual stenosis.  Mid RCA lesion, 95 %stenosed. A STENT PROMUS PREM MR 2.5X32 drug eluting stent was successfully placed, postdilated to > 3 mm in diameter.  Post intervention, there is a 0% residual stenosis.   Continue dual antiplatelet therapy along with aggressive secondary prevention.  I stressed the importance of dual antiplatelet therapy.  She will need a repeat echo in 6- 8 weeks to see that her LVEF improved.  Add ACE-I and continue beta blocker for LV dysfunction.    Diagnostic Diagram     Post-Intervention Diagram        Patient Profile     60 y.o. female with a history of obesity, HTN and no prior cardiac history who presented to [facility] with chest pain. Initial trop 0.49 started on IV heparin and underwent LHC.   Assessment & Plan    1. NSTEMI: Underwent LHC with Dr. Eldridge Dace showing 99% mid LAD, Mid RCA with 95%, and 80% 1st mrg, all treated with DES. EF by visual estimate was 35-40% with normal LVEDP. Plan will be to continue with ASA and Brilinta, along with aggressive secondary prevention. Will need a follow up echo in 6-8 weeks to reassess LV function. Trop peaked at 1.56. -- continue ASA, Brilinta, lisinopril and BB  -- Was on home estradiol for hot flashes, question whether she will need to discontinue this?  2. HTN: Well controlled.   3. QT prolongation: New this morning on EKG. Am labs still pending. Avoid QT prolonging medications.  4. Obesity: diet and exercise encouraged.   Signed, Laverda Page, NP  07/11/2016, 6:39 AM

## 2016-07-11 NOTE — Care Management Note (Signed)
Case Management Note  Patient Details  Name: Amanda GasserKaren S Dawson MRN: [id number] Date of Birth: 04/07/1956  Subjective/Objective:  S/p coronary intervention, will be on brilinta, patient will be participating in the twilight study.  No other needs.                  Action/Plan:   Expected Discharge Date:                  Expected Discharge Plan:  Home/Self Care  In-House Referral:     Discharge planning Services  CM Consult  Post Acute Care Choice:    Choice offered to:     DME Arranged:    DME Agency:     HH Arranged:    HH Agency:     Status of Service:  Completed, signed off  If discussed at [employer] Length of Stay Meetings, dates discussed:    Additional Comments:  Leone Havenaylor, Istvan Behar Clinton, RN 07/11/2016, 11:27 AM

## 2016-07-11 NOTE — Research (Signed)
TWILIGHT Informed Consent   Subject Name: Amanda Dawson  Subject met inclusion and exclusion criteria.  The informed consent form, study requirements and expectations were reviewed with the subject and questions and concerns were addressed prior to the signing of the consent form.  The subject verbalized understanding of the trail requirements.  The subject agreed to participate in the TWILIGHT trial and signed the informed consent.  The informed consent was obtained prior to performance of any protocol-specific procedures for the subject.  A copy of the signed informed consent was given to the subject and a copy was placed in the subject's medical record.  Mable Fill, Marissa Nestle 07/11/2016, 7:24am

## 2016-07-11 NOTE — Progress Notes (Signed)
Patient Name: Amanda GasserKaren S Dawson Date of Encounter: 07/11/2016  Principal Problem:   NSTEMI (non-ST elevated myocardial infarction) [facility](HCC) Active Problems:   Hypertension   Length of Stay: 1  SUBJECTIVE  The patient feels much better today, denies any chest pain or shortness of breath.  CURRENT MEDS . aspirin EC  81 mg Oral Daily  . atorvastatin  80 mg Oral q1800  . carvedilol  3.125 mg Oral BID WC  . lisinopril  20 mg Oral Daily  . pantoprazole  40 mg Oral Daily  . progesterone  100 mg Oral Daily  . sodium chloride flush  3 mL Intravenous Q12H  . sodium chloride flush  3 mL Intravenous Q12H  . ticagrelor  90 mg Oral BID  . venlafaxine XR  75 mg Oral Daily   OBJECTIVE  Vitals:   07/10/16 2050 07/10/16 2100 07/11/16 0323 07/11/16 0746  BP: 131/70 (!) 130/105 (!) 130/56 136/65  Pulse: 71 72 69 77  Resp: 17 (!) 23 20 17   Temp:   97.7 F (36.5 C) 97.5 F (36.4 C)  TempSrc:   Oral Oral  SpO2: 96% 96% 100% 97%  Weight:   161 lb 9.6 oz (73.3 kg)   Height:        Intake/Output Summary (Last 24 hours) at 07/11/16 0854 Last data filed at 07/11/16 0746  Gross per 24 hour  Intake           755.63 ml  Output             1200 ml  Net          -444.37 ml   Filed Weights   07/10/16 1023 07/11/16 0323  Weight: 162 lb (73.5 kg) 161 lb 9.6 oz (73.3 kg)    PHYSICAL EXAM  General: Pleasant, NAD. Neuro: Alert and oriented X 3. Moves all extremities spontaneously. Psych: Normal affect. HEENT:  Normal  Neck: Supple without bruits or JVD. Lungs:  Resp regular and unlabored, CTA. Heart: RRR no s3, s4, or murmurs. Abdomen: Soft, non-tender, non-distended, BS + x 4.  Extremities: No clubbing, cyanosis or edema. DP/PT/Radials 2+ and equal bilaterally.  Accessory Clinical Findings  CBC  Recent Labs  07/10/16 1030 07/11/16 0558  WBC 6.0 10.8*  HGB 15.5* 15.9*  HCT 44.3 46.3*  MCV 92.3 94.5  PLT 241 292   Basic Metabolic Panel  Recent Labs  07/10/16 1030  07/11/16 0558  NA 136 136  K 4.2 4.1  CL 106 100*  CO2 20* 26  GLUCOSE 109* 111*  BUN 17 9  CREATININE 1.11* 0.99  CALCIUM 9.2 9.2   Liver Function Tests  Recent Labs  07/10/16 1030 07/11/16 0558  AST 35 45*  ALT 35 35  ALKPHOS 64 65  BILITOT 1.0 1.8*  PROT 6.4* 6.9  ALBUMIN 4.2 4.1   No results for input(s): LIPASE, AMYLASE in the last 72 hours. Cardiac Enzymes  Recent Labs  07/10/16 1803 07/10/16 2308 07/11/16 0558  TROPONINI 1.56* 1.22* 2.08*   BNP Invalid input(s): POCBNP D-Dimer No results for input(s): DDIMER in the last 72 hours. Hemoglobin A1C  Recent Labs  07/10/16 1803  HGBA1C 5.0   Fasting Lipid Panel  Recent Labs  07/11/16 0558  CHOL 190  HDL 38*  LDLCALC 105*  TRIG 236*  CHOLHDL 5.0   Thyroid Function Tests  Recent Labs  07/10/16 1803  TSH 2.008    Radiology/Studies  Dg Chest 2 View  Result Date: 07/10/2016 CLINICAL DATA:  Chest pain. EXAM: CHEST  2 VIEW COMPARISON:  No recent prior . FINDINGS: Mediastinum and hilar structures normal. Low lung volumes. No pleural effusion or pneumothorax. Borderline cardiomegaly with normal pulmonary vascularity. No acute bony abnormality . IMPRESSION: Low lung volumes. Borderline cardiomegaly. No pulmonary venous congestion . Electronically Signed   By: Maisie Fus  Register   On: 07/10/2016 13:30   TELE: Sinus rhythm  ECG: Sinus rhythm, negative T waves in the anterolateral leads.  Left cardiac cath: 07/10/2016  The left ventricular ejection fraction is 35-45% by visual estimate.  There is mild to moderate left ventricular systolic dysfunction.  LV end diastolic pressure is normal.  There is no aortic valve stenosis.  Mid LAD lesion, 99 %stenosed. A STENT PROMUS PREM MR 2.5X38 drug eluting stent was successfully placed, postdilated to 2.8 mm.  Post intervention, there is a 0% residual stenosis.  1st Mrg lesion, 80 %stenosed. A STENT PROMUS PREM MR 2.75X12 drug eluting stent was  successfully placed, postdilated to 3.0 mm.  Post intervention, there is a 0% residual stenosis.  Mid RCA lesion, 95 %stenosed. A STENT PROMUS PREM MR 2.5X32 drug eluting stent was successfully placed, postdilated to > 3 mm in diameter.  Post intervention, there is a 0% residual stenosis.   Continue dual antiplatelet therapy along with aggressive secondary prevention.  I stressed the importance of dual antiplatelet therapy.  She will need a repeat echo in 6- 8 weeks to see that her LVEF improved.  Add ACE-I and continue beta blocker for LV dysfunction.    ASSESSMENT AND PLAN  A very pleasant 60 year old female with history of hyperlipidemia, who presented yesterday with typical angin  and was ruled in for non-STEMI, she underwent left cardiac catheterization yesterday that showed decreased LV ejection fraction of 35-45% on ventriculogram, she also had three-vessel disease with 99% stenosis in mid LAD that was stented, 80% stenosis of the first marginal branch that was stented, and 95% mid RCA lesion that was stented as well.  The patient was enrolled in a Twilight study and was started on aspirin and Brilinta that she will take for at least a year, she was also started on atorvastatin 80 mg daily, as well as metoprolol, we will add low-dose lisinopril today. Her creatinine is normal her AST is borderline and we will follow at the outpatient visit.  The patient will be discharged today, she will follow in our clinic in 2 weeks, she will have an echocardiogram performed prior to discharge, we will check her CBC and CMP at the next visit. We will recheck echocardiogram in 6 weeks. She is to stay at home until the end of the week, we will write her a work note. We will send referral for cardiac rehabilitation.  Her access site in her right radial artery is slightly bruised but there are good pulses and no pain, she is advised not to carry anything heavy in that hand for a week.  Signed, Tobias Alexander MD, [facility] 07/11/2016

## 2016-07-11 NOTE — Progress Notes (Signed)
Co-pay for Brilinta 90 mg 2 a day $24.99 ,NO PA required

## 2016-07-13 ENCOUNTER — Telehealth: Payer: Self-pay | Admitting: Cardiology

## 2016-07-13 NOTE — Telephone Encounter (Signed)
New Message  Pt voiced calling to following up to get release note to return back to work on 07/17/16.  Please f/u with pt

## 2016-07-13 NOTE — Telephone Encounter (Deleted)
07/13/16  07/13/16 Dr.Jaffe's office left a message for Mrs.Keaton to call them.  Received: Today  Message Contents  Deborah D Miller    

## 2016-07-13 NOTE — Telephone Encounter (Signed)
Left a detailed message on the pts confirmed VM, that when she comes in on her 10/31 post hospital follow-up appt with Boyce MediciBrittany Simmons PA-C, then at that time is when recommendations will be given to the pt on when she can safely return back to work.  Left a detailed message on the pts confirmed VM, to call the office back with any additional questions regarding this.

## 2016-07-17 NOTE — Telephone Encounter (Signed)
Refer to letters, for recommendation on when the pt may safely return back to work.  This was written on 10/24 by Geoffry ParadiseLindsey Roberts NP, for the pt to return back to work on 10/30.  Pt to come in and see Boyce MediciBrittany Simmons PA-C, for post-hospital follow-up on 07/18/16 at 0930.

## 2016-07-18 ENCOUNTER — Encounter: Payer: Self-pay | Admitting: *Deleted

## 2016-07-18 ENCOUNTER — Encounter: Payer: Self-pay | Admitting: Cardiology

## 2016-07-18 ENCOUNTER — Ambulatory Visit (INDEPENDENT_AMBULATORY_CARE_PROVIDER_SITE_OTHER): Payer: BLUE CROSS/BLUE SHIELD | Admitting: Cardiology

## 2016-07-18 VITALS — BP 120/82 | HR 65 | Ht 61.0 in | Wt 158.8 lb

## 2016-07-18 DIAGNOSIS — E7849 Other hyperlipidemia: Secondary | ICD-10-CM

## 2016-07-18 DIAGNOSIS — E784 Other hyperlipidemia: Secondary | ICD-10-CM

## 2016-07-18 DIAGNOSIS — I2510 Atherosclerotic heart disease of native coronary artery without angina pectoris: Secondary | ICD-10-CM

## 2016-07-18 DIAGNOSIS — I214 Non-ST elevation (NSTEMI) myocardial infarction: Secondary | ICD-10-CM | POA: Diagnosis not present

## 2016-07-18 DIAGNOSIS — Z79899 Other long term (current) drug therapy: Secondary | ICD-10-CM | POA: Diagnosis not present

## 2016-07-18 NOTE — Progress Notes (Signed)
07/18/2016 Amanda GasserKaren S Dawson   10/20/1955  [id number]  Primary Physician Malka SoJOBE,[name], MD Primary Cardiologist: Dr. Delton SeeNelson    [facility] for CAD s/p NSTEMI; Systolic HF   HPI:  Ms. Amanda RobesBalance presents to clinic today for post hospital, TOC, f/u after recent hospitalization for NSTEMI. TOC phone call was made and documented on 07/13/16. She is a 60 y/o, postmenopausal, female who had been on Estrogen therapy, also with a h/o THN and obesity, who [facility] with a CC of SSCP. Symptoms were relieved in the ED with SL NTG. Given her symptomology, she was admitted and ruled in for NSTEMI with positive enzymes. She underwent LHC with Dr. Eldridge DaceVaranasi showing showed decreased LV ejection fraction of 35-45% on ventriculogram, she also had three-vessel disease with 99% stenosis in mid LAD that was stented, 80% stenosis of the first marginal branch that was stented, and 95% mid RCA lesion that was stented as well. Her follow up Echo showed EF of 35-40% with akinesis of the mid-apicalanteroseptal and apical myocardium, along with G1DD. She decided to enroll in the Twilight study and will continue on ASA and Brilinta for at least one year. This admission she was also started on atorvastatin 80mg , low dose coreg and her home lisinopril was continued. Patient does take home estradiol and progesterone. It was asked that the patient  make a follow up appt with her OB/GYN to discuss reducing or stopping these given her recent findings of CAD.  Today in f/u, she reports that she has done well. She denies recurrent CP. She has not required use of SLNTG. No dyspnea, orthopnea, PND, weight gain or LEE. She has been fully complaint with meds and instructions from research team. She is tolerating new meds well. HR and BP both well controlled and stable. BP is 120/82. HR 65 bpm. No abnormal bleeding with DAPT. No myalgias with Lipitor. Her gynecologist discontinued her hormone therapy.  She has adjusted her diet. Eating more heart healthy foods. She and her husband walk for exercise. She does 2 8 minute walks/day. She is interested in cardiac rehab. She denies tobacco use.    Current Meds  Medication Sig  . AMBULATORY NON FORMULARY MEDICATION Take 90 mg by mouth 2 (two) times daily. Medication Name: Brilinta 90 mg BID (TWILIGHT research study PROVIDED)  . AMBULATORY NON FORMULARY MEDICATION Take 81 mg by mouth daily. Medication Name: Aspirin 81 mg daily (TWILIGHT Research study provided)  . atorvastatin (LIPITOR) 80 MG tablet Take 1 tablet (80 mg total) by mouth daily at 6 PM.  . calcium citrate-vitamin D (CITRACAL+D) 315-200 MG-UNIT tablet Take 1 tablet by mouth daily.  . carvedilol (COREG) 3.125 MG tablet Take 1 tablet (3.125 mg total) by mouth 2 (two) times daily with a meal.  . Cholecalciferol (VITAMIN D3) 5000 units TABS Take 1 capsule by mouth daily.  Marland Kitchen. estradiol (CLIMARA - DOSED IN MG/24 HR) 0.1 mg/24hr patch Place 1 patch onto the skin once a week.  Marland Kitchen. lisinopril (PRINIVIL,ZESTRIL) 20 MG tablet Take 20 mg by mouth daily.  . nitroGLYCERIN (NITROSTAT) 0.4 MG SL tablet Place 1 tablet (0.4 mg total) under the tongue every 5 (five) minutes x 3 doses as needed for chest pain.  Marland Kitchen. omeprazole (PRILOSEC) 20 MG capsule Take 20 mg by mouth daily.  . progesterone (PROMETRIUM) 100 MG capsule Take 100 mg by mouth daily.  Marland Kitchen. venlafaxine XR (EFFEXOR-XR) 75 MG 24 hr capsule Take 75 mg by mouth daily.  No Known Allergies Past Medical History:  Diagnosis Date  . Coronary artery disease    a. 10/17 PCI with DES to mid LAD, Ist mrg, and Mid RCA, EF 35-40%  . GERD (gastroesophageal reflux disease)   . High cholesterol   . Hypertension   . NSTEMI (non-ST elevated myocardial infarction) (HCC) 07/10/2016  . Obesity   . TGA (transient global amnesia)    "several times" (07/10/2016)   Family History  Problem Relation Age of Onset  . Hypertension Mother   . Heart disease Brother      Not sure what   Past Surgical History:  Procedure Laterality Date  . CARDIAC CATHETERIZATION N/A 07/10/2016   Procedure: Left Heart Cath and Coronary Angiography;  Surgeon: Corky Crafts, MD;  Location: [facility] INVASIVE CV LAB;  Service: Cardiovascular;  Laterality: N/A;  . CARDIAC CATHETERIZATION N/A 07/10/2016   Procedure: Coronary Stent Intervention;  Surgeon: Corky Crafts, MD;  Location: [facility] INVASIVE CV LAB;  Service: Cardiovascular;  Laterality: N/A;  . CORONARY ANGIOPLASTY WITH STENT PLACEMENT  07/10/2016   Social History   Social History  . Marital status: Married    Spouse name: N/A  . Number of children: N/A  . Years of education: N/A   Occupational History  . Not on file.   Social History Main Topics  . Smoking status: Never Smoker  . Smokeless tobacco: Never Used  . Alcohol use 7.2 oz/week    2 Glasses of wine, 10 Shots of liquor per week  . Drug use: No  . Sexual activity: Yes   Other Topics Concern  . Not on file   Social History Narrative  . No narrative on file     Review of Systems: General: negative for chills, fever, night sweats or weight changes.  Cardiovascular: negative for chest pain, dyspnea on exertion, edema, orthopnea, palpitations, paroxysmal nocturnal dyspnea or shortness of breath Dermatological: negative for rash Respiratory: negative for cough or wheezing Urologic: negative for hematuria Abdominal: negative for nausea, vomiting, diarrhea, bright red blood per rectum, melena, or hematemesis Neurologic: negative for visual changes, syncope, or dizziness All other systems reviewed and are otherwise negative except as noted above.   Physical Exam:  Blood pressure 120/82, pulse 65, height 5\' 1"  (1.549 m), weight 158 lb 12.8 oz (72 kg), SpO2 97 %.  General appearance: alert, cooperative and no distress Neck: no carotid bruit and no JVD Lungs: clear to auscultation bilaterally Heart: regular rate and rhythm, S1, S2 normal, no murmur,  click, rub or gallop Extremities: extremities normal, atraumatic, no cyanosis or edema Pulses: 2+ and symmetric Skin: Skin color, texture, turgor normal. No rashes or lesions Neurologic: Grossly normal  EKG NSR. 65 bpm. No ischemia.   ASSESSMENT AND PLAN:    1. CAD: s/p recent NSTEMI secondary to 3V obstructive disease with LHC showing 99% stenosis in mid LAD that was stented, 80% stenosis of the first marginal branch that was stented, and 95% mid RCA lesion that was stented as well. EF 35-40% by echo. She is stable w/o recurrent CP. No dyspnea. She has not had to use SL NTG. VSS. Continue DAPT with ASA + Brilinta (Twilight study), BB, statin + ACE-I. She has PRN SL NTG in case of recurrent angina. We discussed proper use. Will refer to OP cardiac rehab.   2. Systolic HF: EF post NSTEMI was 35-40%. No s/s of acute CHF. She is euvolemic. Continue BB and ACE-I therapy. HR limits upward titration of BB. We discussed low  sodium diet. She does not require a diuretic at this point. Patient advised to call if signs of fluid retention or symptoms of dyspnea.   3. HTN: well controlled on current regimen. Continue Coreg and lisinopril.   4. HLD: recent lipid panel showed LDL level at 105 mg/dL. Goal given multivessl CAD is <70 mg/dL. Continue high dose Lipitor, 80 mg nightly. Recheck FLP and HFTs in 4 more weeks.   5. H/o Hormone Replacement Therapy: estradiol and progesterone recently discontinued by her gynecologist.     PLAN  F/u with Dr. Delton SeeNelson in 6-8 weeks.   Brittainy Simmons PA-C 07/18/2016 10:18 AM

## 2016-07-18 NOTE — Patient Instructions (Addendum)
Medication Instructions:   Your physician recommends that you continue on your current medications as directed. Please refer to the Current Medication list given to you today.   If you need a refill on your cardiac medications before your next appointment, please call your pharmacy.  Labwork: RETURN IN 4 WEEKS FOR FASTING LIPIDS AND LFT    Testing/Procedures: NONE ORDERED  TODAY    Follow-Up:   WITH DR NELSON IN 6 TO 8 WEEKS    [facility].. SOME WILL CONTACT YOUR FROM THAT CLINIC FOR FURTHER STAEPS    Any Other Special Instructions Will Be Listed Below (If Applicable).

## 2016-08-07 ENCOUNTER — Telehealth: Payer: Self-pay | Admitting: *Deleted

## 2016-08-07 NOTE — Telephone Encounter (Signed)
Left message for patient to call research office for month 1 telephone follow up visit for th TWILIGHT research study.

## 2016-08-08 ENCOUNTER — Encounter: Payer: Self-pay | Admitting: *Deleted

## 2016-08-08 DIAGNOSIS — Z006 Encounter for examination for normal comparison and control in clinical research program: Secondary | ICD-10-CM

## 2016-08-08 NOTE — Progress Notes (Signed)
TWILIGHT Research study month 1 telephone follow up visit completed. Patient denies any bleeding events or other adverse events. She states she has been compliant with research provided medication. Next research required appointment is due no later than 4/FEB/2018 and the research office will call and schedule. Questions encouraged and answered.

## 2016-08-15 ENCOUNTER — Other Ambulatory Visit: Payer: BLUE CROSS/BLUE SHIELD | Admitting: *Deleted

## 2016-08-15 DIAGNOSIS — E7849 Other hyperlipidemia: Secondary | ICD-10-CM

## 2016-08-15 DIAGNOSIS — Z79899 Other long term (current) drug therapy: Secondary | ICD-10-CM

## 2016-08-15 LAB — LIPID PANEL
CHOLESTEROL: 83 mg/dL (ref <200)
HDL: 37 mg/dL — ABNORMAL LOW (ref >50)
LDL Cholesterol: 20 mg/dL (ref <100)
TRIGLYCERIDES: 132 mg/dL (ref <150)
Total CHOL/HDL Ratio: 2.2 Ratio (ref <5.0)
VLDL: 26 mg/dL (ref <30)

## 2016-08-15 LAB — HEPATIC FUNCTION PANEL
ALT: 40 U/L — AB (ref 6–29)
AST: 33 U/L (ref 10–35)
Albumin: 4.4 g/dL (ref 3.6–5.1)
Alkaline Phosphatase: 76 U/L (ref 33–130)
BILIRUBIN DIRECT: 0.4 mg/dL — AB (ref <=0.2)
Indirect Bilirubin: 1.3 mg/dL — ABNORMAL HIGH (ref 0.2–1.2)
TOTAL PROTEIN: 6.7 g/dL (ref 6.1–8.1)
Total Bilirubin: 1.7 mg/dL — ABNORMAL HIGH (ref 0.2–1.2)

## 2016-08-22 ENCOUNTER — Telehealth: Payer: Self-pay | Admitting: *Deleted

## 2016-08-22 DIAGNOSIS — Z79899 Other long term (current) drug therapy: Secondary | ICD-10-CM

## 2016-08-22 MED ORDER — ATORVASTATIN CALCIUM 80 MG PO TABS: 40.0000 mg | ORAL_TABLET | Freq: Every day | ORAL | 6 refills | Status: DC

## 2016-08-22 NOTE — Telephone Encounter (Signed)
Pt aware of her lab results. She will decrease the Lipitor to 40 mg qd and repeat labs in 3 months. Orders in EPIC.

## 2016-08-22 NOTE — Telephone Encounter (Signed)
-----   Message from [place] M Simmons, [place] sent at 08/17/2016  4:03 PM EST ----- LDL (bad cholesterol is at goal of < 70). LDL is now 20, down from 105. ALT which is a liver enzyme is slightly elevated at 40. Recommend that she reduce Lipitor in half from 80 to 40 mg nightly. Repeat FLP and HFTs in 3 months.

## 2016-09-04 ENCOUNTER — Telehealth: Payer: Self-pay | Admitting: Cardiology

## 2016-09-04 NOTE — Telephone Encounter (Signed)
She is cleared from cardiac standpoint for cardiac surgery.

## 2016-09-04 NOTE — Telephone Encounter (Signed)
[facility] at [facility] wants to know if this patient is cleared to have her teeth cleaned.

## 2016-09-04 NOTE — Telephone Encounter (Signed)
Pts Dentist office is calling to obtain cardiac/medication clearance for the pt to have a routine dental cleaning done.  Per Whitney at the pts Dentist office, her dental cleaning was initially scheduled for early tomorrow morning, but they will be rescheduling the pt to have this done sometime in the near future, once Dr Delton SeeNelson safely advises.  Per Whitney, the clearance note should be faxed to their office at [phone] [facility].  Informed Whitney that Dr Delton SeeNelson is out of the office today, but I will route this clearance request for her to review and advise on, and follow-up with their office accordingly.  Whitney verbalized understanding and agrees with this plan.

## 2016-09-05 ENCOUNTER — Encounter: Payer: Self-pay | Admitting: *Deleted

## 2016-09-05 NOTE — Telephone Encounter (Signed)
Will send clearance letter per Dr Delton SeeNelson, for this pt to proceed with getting her routine dental cleaning done, to Whitney at [facility].

## 2016-09-25 ENCOUNTER — Encounter: Payer: Self-pay | Admitting: *Deleted

## 2016-09-25 ENCOUNTER — Ambulatory Visit (INDEPENDENT_AMBULATORY_CARE_PROVIDER_SITE_OTHER): Payer: BLUE CROSS/BLUE SHIELD | Admitting: Cardiology

## 2016-09-25 ENCOUNTER — Encounter: Payer: Self-pay | Admitting: Cardiology

## 2016-09-25 ENCOUNTER — Other Ambulatory Visit: Payer: Self-pay | Admitting: *Deleted

## 2016-09-25 VITALS — BP 136/70 | HR 69 | Ht 61.0 in | Wt 152.0 lb

## 2016-09-25 DIAGNOSIS — I255 Ischemic cardiomyopathy: Secondary | ICD-10-CM | POA: Insufficient documentation

## 2016-09-25 DIAGNOSIS — K219 Gastro-esophageal reflux disease without esophagitis: Secondary | ICD-10-CM | POA: Insufficient documentation

## 2016-09-25 DIAGNOSIS — Z006 Encounter for examination for normal comparison and control in clinical research program: Secondary | ICD-10-CM

## 2016-09-25 DIAGNOSIS — F419 Anxiety disorder, unspecified: Secondary | ICD-10-CM | POA: Insufficient documentation

## 2016-09-25 DIAGNOSIS — I2510 Atherosclerotic heart disease of native coronary artery without angina pectoris: Secondary | ICD-10-CM

## 2016-09-25 DIAGNOSIS — E785 Hyperlipidemia, unspecified: Secondary | ICD-10-CM

## 2016-09-25 DIAGNOSIS — R569 Unspecified convulsions: Secondary | ICD-10-CM

## 2016-09-25 DIAGNOSIS — I214 Non-ST elevation (NSTEMI) myocardial infarction: Secondary | ICD-10-CM

## 2016-09-25 DIAGNOSIS — E784 Other hyperlipidemia: Secondary | ICD-10-CM

## 2016-09-25 DIAGNOSIS — E7849 Other hyperlipidemia: Secondary | ICD-10-CM

## 2016-09-25 DIAGNOSIS — I10 Essential (primary) hypertension: Secondary | ICD-10-CM

## 2016-09-25 HISTORY — DX: Ischemic cardiomyopathy: I25.5

## 2016-09-25 HISTORY — DX: Anxiety disorder, unspecified: F41.9

## 2016-09-25 HISTORY — DX: Unspecified convulsions: R56.9

## 2016-09-25 MED ORDER — AMBULATORY NON FORMULARY MEDICATION: 81.0000 mg | Freq: Every day | Status: DC

## 2016-09-25 NOTE — Progress Notes (Signed)
09/25/2016 Amanda Dawson   01/30/1956  [id number]  Primary Physician [name]., MD Primary Cardiologist: Dr. Delton See    Reason for Visit/CC: [facility] F/u for CAD s/p NSTEMI; Systolic HF   HPI:  Amanda Dawson presents to clinic today for post hospital, TOC, f/u after recent hospitalization for NSTEMI. TOC phone call was made and documented on 07/13/16. She is a 61 y/o, postmenopausal, female who had been on Estrogen therapy, also with a h/o THN and obesity, who presented to [facility] on 07/10/16 with a CC of SSCP. Symptoms were relieved in the ED with SL NTG. Given her symptomology, she was admitted and ruled in for NSTEMI with positive enzymes. She underwent LHC with Dr. Eldridge Dace showing showed decreased LV ejection fraction of 35-45% on ventriculogram, she also had three-vessel disease with 99% stenosis in mid LAD that was stented, 80% stenosis of the first marginal branch that was stented, and 95% mid RCA lesion that was stented as well. Her follow up Echo showed EF of 35-40% with akinesis of the mid-apicalanteroseptal and apical myocardium, along with G1DD. She decided to enroll in the Twilight study and will continue on ASA and Brilinta for at least one year. This admission she was also started on atorvastatin 80mg , low dose coreg and her home lisinopril was continued. Patient does take home estradiol and progesterone. It was asked that the patient  make a follow up appt with her OB/GYN to discuss reducing or stopping these given her recent findings of CAD.  09/26/2015 - patient is coming after 2 months, she feels that she is fully recovered, she has a lot of energy, she denies any chest pain shortness of breath on exertion , she has returned to work just 2 weeks after her NSTEMI and was able to work full time.  She denies any lower extremity edema orthopnea proximal nocturnal dyspnea no palpitations or syncope. He has been fully compliant to her meds and has no side effects with any of them  other than easy bruising.   Current Meds  Medication Sig  . AMBULATORY NON FORMULARY MEDICATION Take 90 mg by mouth 2 (two) times daily. Medication Name: Brilinta 90 mg BID (TWILIGHT research study PROVIDED)  . AMBULATORY NON FORMULARY MEDICATION Take 81 mg by mouth daily. Medication Name: Aspirin 81 mg daily (TWILIGHT Research study provided)  . atorvastatin (LIPITOR) 40 MG tablet Take 40 mg by mouth daily.  . calcium citrate-vitamin D (CITRACAL+D) 315-200 MG-UNIT tablet Take 1 tablet by mouth daily.  . carvedilol (COREG) 3.125 MG tablet Take 1 tablet (3.125 mg total) by mouth 2 (two) times daily with a meal.  . Cholecalciferol (VITAMIN D3) 5000 units TABS Take 1 capsule by mouth daily.  Marland Kitchen lisinopril (PRINIVIL,ZESTRIL) 20 MG tablet Take 20 mg by mouth daily.  . nitroGLYCERIN (NITROSTAT) 0.4 MG SL tablet Place 1 tablet (0.4 mg total) under the tongue every 5 (five) minutes x 3 doses as needed for chest pain.  Marland Kitchen omeprazole (PRILOSEC) 20 MG capsule Take 20 mg by mouth daily.  Marland Kitchen venlafaxine XR (EFFEXOR-XR) 75 MG 24 hr capsule Take 75 mg by mouth daily.   Allergies  Allergen Reactions  . Nsaids     Renal insufficiency   Past Medical History:  Diagnosis Date  . Coronary artery disease    a. 10/17 PCI with DES to mid LAD, Ist mrg, and Mid RCA, EF 35-40%  . GERD (gastroesophageal reflux disease)   . High cholesterol   . Hypertension   .  NSTEMI (non-ST elevated myocardial infarction) (HCC) 07/10/2016  . Obesity   . TGA (transient global amnesia)    "several times" (07/10/2016)   Family History  Problem Relation Age of Onset  . Hypertension Mother   . Heart disease Brother     Not sure what   Past Surgical History:  Procedure Laterality Date  . CARDIAC CATHETERIZATION N/A 07/10/2016   Procedure: Left Heart Cath and Coronary Angiography;  Surgeon: Corky CraftsJayadeep S Varanasi, MD;  [facility];  Service: Cardiovascular;  Laterality: N/A;  . CARDIAC CATHETERIZATION N/A  07/10/2016   Procedure: Coronary Stent Intervention;  Surgeon: Corky CraftsJayadeep S Varanasi, MD;  [facility];  Service: Cardiovascular;  Laterality: N/A;  . CORONARY ANGIOPLASTY WITH STENT PLACEMENT  07/10/2016   Social History   Social History  . Marital status: Married    Spouse name: N/A  . Number of children: N/A  . Years of education: N/A   Occupational History  . Not on file.   Social History Main Topics  . Smoking status: Never Smoker  . Smokeless tobacco: Never Used  . Alcohol use 7.2 oz/week    2 Glasses of wine, 10 Shots of liquor per week  . Drug use: No  . Sexual activity: Yes   Other Topics Concern  . Not on file   Social History Narrative  . No narrative on file     Review of Systems: General: negative for chills, fever, night sweats or weight changes.  Cardiovascular: negative for chest pain, dyspnea on exertion, edema, orthopnea, palpitations, paroxysmal nocturnal dyspnea or shortness of breath Dermatological: negative for rash Respiratory: negative for cough or wheezing Urologic: negative for hematuria Abdominal: negative for nausea, vomiting, diarrhea, bright red blood per rectum, melena, or hematemesis Neurologic: negative for visual changes, syncope, or dizziness All other systems reviewed and are otherwise negative except as noted above.   Physical Exam:  Blood pressure 136/70, pulse 69, height 5\' 1"  (1.549 m), weight 152 lb (68.9 kg), SpO2 99 %.  General appearance: alert, cooperative and no distress Neck: no carotid bruit and no JVD Lungs: clear to auscultation bilaterally Heart: regular rate and rhythm, S1, S2 normal, no murmur, click, rub or gallop Extremities: extremities normal, atraumatic, no cyanosis or edema Pulses: 2+ and symmetric Skin: Skin color, texture, turgor normal. No rashes or lesions Neurologic: Grossly normal  EKG NSR. 65 bpm. No ischemia.    ASSESSMENT AND PLAN:   1. CAD: s/p NSTEMI in 06/2016 secondary to 3V  obstructive disease with LHC showing 99% stenosis in mid LAD that was stented, 80% stenosis of the first marginal branch that was stented, and 95% mid RCA lesion that was stented as well. EF 35-40% by echo. She is stable w/o recurrent CP. NYHA I. Continue DAPT with ASA + Brilinta (Twilight study), BB, statin + ACE-I. She has PRN SL NTG in case of recurrent angina.   2. Ischemic CMP - Systolic HF: EF post NSTEMI was 35-40%. NYHA I, advised to start exercising, 10 minutes daily and built up, she has a gym at work. Repeat echocardiogram now. Continue lisinopril and carvedilol.  3. HTN: well controlled on current regimen. Continue Coreg and lisinopril.   4. HLD: recent lipid panel showed LDL level at 105 -->20 mg/dL. Atorvastatin was decreased to 40 mg po daily.  5. H/o Hormone Replacement Therapy: estradiol and progesterone recently discontinued by her gynecologist.   FMLA paperwork filled today.   PLAN  F/u with Dr. Delton SeeNelson in 3  months.  Tobias Alexander , MD 09/25/2016 8:19 AM

## 2016-09-25 NOTE — Patient Instructions (Addendum)
Medication Instructions:   Your physician recommends that you continue on your current medications as directed. Please refer to the Current Medication list given to you today.    Testing:  Your physician has requested that you have an echocardiogram. Echocardiography is a painless test that uses sound waves to create images of your heart. It provides your doctor with information about the size and shape of your heart and how well your heart's chambers and valves are working. This procedure takes approximately one hour. There are no restrictions for this procedure.    Follow-Up:  3 MONTHS WITH DR Delton SeeNELSON       If you need a refill on your cardiac medications before your next appointment, please call your pharmacy.

## 2016-09-26 NOTE — Progress Notes (Signed)
TWILIGHT Research 3 month randomization visit completed. Patient denies any adverse events. She states she has been compliant with medication. Today she has been randomized to ASA 81 mg Daily or PLACEBO as part of the TWILIGHT protocol. She was dispensed the following bottle numbers [id number]; [id number]; [id number] (by Traci SermonH. Pruitt, RN).

## 2016-10-02 ENCOUNTER — Ambulatory Visit (HOSPITAL_COMMUNITY): Payer: BLUE CROSS/BLUE SHIELD | Attending: Cardiology

## 2016-10-02 ENCOUNTER — Other Ambulatory Visit: Payer: Self-pay

## 2016-10-02 DIAGNOSIS — I214 Non-ST elevation (NSTEMI) myocardial infarction: Secondary | ICD-10-CM | POA: Diagnosis not present

## 2016-10-02 DIAGNOSIS — E785 Hyperlipidemia, unspecified: Secondary | ICD-10-CM | POA: Insufficient documentation

## 2016-10-02 DIAGNOSIS — I10 Essential (primary) hypertension: Secondary | ICD-10-CM

## 2016-10-02 DIAGNOSIS — I255 Ischemic cardiomyopathy: Secondary | ICD-10-CM | POA: Insufficient documentation

## 2016-10-02 DIAGNOSIS — I2510 Atherosclerotic heart disease of native coronary artery without angina pectoris: Secondary | ICD-10-CM | POA: Diagnosis not present

## 2016-10-02 DIAGNOSIS — R9439 Abnormal result of other cardiovascular function study: Secondary | ICD-10-CM | POA: Insufficient documentation

## 2016-11-06 ENCOUNTER — Encounter (HOSPITAL_COMMUNITY): Payer: Self-pay | Admitting: Internal Medicine

## 2016-11-06 NOTE — Progress Notes (Signed)
Mailed patient letter with information about Cardiac Rehab program. MW °

## 2016-11-07 ENCOUNTER — Telehealth: Payer: Self-pay | Admitting: *Deleted

## 2016-11-07 ENCOUNTER — Encounter: Payer: Self-pay | Admitting: *Deleted

## 2016-11-07 DIAGNOSIS — Z006 Encounter for examination for normal comparison and control in clinical research program: Secondary | ICD-10-CM

## 2016-11-07 NOTE — Progress Notes (Signed)
TWILIGHT research month 4 telephone follow up completed. Patient states she has not had any issues and has been compliant with research provided medication. Next research required visit due no later than 19/AUG/2018. Questions encouraged and answered.

## 2016-11-07 NOTE — Telephone Encounter (Signed)
Left message for patient to call research office for month 4 follow up visit for the TWILIGHT research study.

## 2016-12-05 ENCOUNTER — Encounter: Payer: Self-pay | Admitting: Cardiology

## 2016-12-14 ENCOUNTER — Ambulatory Visit (INDEPENDENT_AMBULATORY_CARE_PROVIDER_SITE_OTHER): Payer: BLUE CROSS/BLUE SHIELD | Admitting: Cardiology

## 2016-12-14 ENCOUNTER — Encounter: Payer: Self-pay | Admitting: Cardiology

## 2016-12-14 VITALS — BP 128/72 | HR 70 | Ht 61.0 in | Wt 145.0 lb

## 2016-12-14 DIAGNOSIS — E7849 Other hyperlipidemia: Secondary | ICD-10-CM

## 2016-12-14 DIAGNOSIS — I10 Essential (primary) hypertension: Secondary | ICD-10-CM | POA: Diagnosis not present

## 2016-12-14 DIAGNOSIS — I255 Ischemic cardiomyopathy: Secondary | ICD-10-CM

## 2016-12-14 DIAGNOSIS — E784 Other hyperlipidemia: Secondary | ICD-10-CM | POA: Diagnosis not present

## 2016-12-14 DIAGNOSIS — I2510 Atherosclerotic heart disease of native coronary artery without angina pectoris: Secondary | ICD-10-CM

## 2016-12-14 DIAGNOSIS — E785 Hyperlipidemia, unspecified: Secondary | ICD-10-CM

## 2016-12-14 DIAGNOSIS — I214 Non-ST elevation (NSTEMI) myocardial infarction: Secondary | ICD-10-CM | POA: Diagnosis not present

## 2016-12-14 MED ORDER — ATORVASTATIN CALCIUM 40 MG PO TABS: 40.0000 mg | ORAL_TABLET | Freq: Every day | ORAL | 3 refills | Status: DC

## 2016-12-14 MED ORDER — CARVEDILOL 3.125 MG PO TABS: 3.1250 mg | ORAL_TABLET | Freq: Two times a day (BID) | ORAL | 3 refills | Status: DC

## 2016-12-14 MED ORDER — LISINOPRIL 20 MG PO TABS: 20.0000 mg | ORAL_TABLET | Freq: Every day | ORAL | 3 refills | Status: DC

## 2016-12-14 NOTE — Patient Instructions (Signed)
Medication Instructions:   Your physician recommends that you continue on your current medications as directed. Please refer to the Current Medication list given to you today.    Labwork:  TODAY--CMET AND CBC W DIFF     Follow-Up:  Your physician wants you to follow-up in: 6 MONTHS WITH DR NELSON You will receive a reminder letter in the mail two months in advance. If you don't receive a letter, please call our office to schedule the follow-up appointment.       If you need a refill on your cardiac medications before your next appointment, please call your pharmacy.   

## 2016-12-14 NOTE — Progress Notes (Signed)
12/15/2016 Amanda Dawson   [birth year]  [id number]  Primary Physician [name]., MD Primary Cardiologist: Dr. Delton See    Reason for Visit/CC: [facility] F/u for CAD s/p NSTEMI; Systolic HF   HPI:  Amanda Dawson presents to clinic today for post hospital, TOC, f/u after recent hospitalization for NSTEMI. TOC phone call was made and documented on 07/13/16. Amanda Dawson is a 61 y/o, postmenopausal, female who had been on Estrogen therapy, also with a h/o THN and obesity, who presented to [facility] on 07/10/16 with a CC of SSCP. Symptoms were relieved in the ED with SL NTG. Given her symptomology, Amanda Dawson was admitted and ruled in for NSTEMI with positive enzymes. Amanda Dawson underwent LHC with Dr. Eldridge Dace showing showed decreased LV ejection fraction of 35-45% on ventriculogram, Amanda Dawson also had three-vessel disease with 99% stenosis in mid LAD that was stented, 80% stenosis of the first marginal branch that was stented, and 95% mid RCA lesion that was stented as well. Her follow up Echo showed EF of 35-40% with akinesis of the mid-apicalanteroseptal and apical myocardium, along with G1DD. Amanda Dawson decided to enroll in the Twilight study and will continue on ASA and Brilinta for at least one year. This admission Amanda Dawson was also started on atorvastatin 80mg , low dose coreg and her home lisinopril was continued. Patient does take home estradiol and progesterone. It was asked that the patient  make a follow up appt with her OB/GYN to discuss reducing or stopping these given her recent findings of CAD.  09/25/2016 - patient is coming after 2 months, Amanda Dawson feels that Amanda Dawson is fully recovered, Amanda Dawson has a lot of energy, Amanda Dawson denies any chest pain shortness of breath on exertion , Amanda Dawson has returned to work just 2 weeks after her NSTEMI and was able to work full time.  Amanda Dawson denies any lower extremity edema orthopnea proximal nocturnal dyspnea no palpitations or syncope. He has been fully compliant to her meds and has no side effects with any of them  other than easy bruising.   12/14/2016 - 2 months follow, Amanda Dawson feels great, denies chest pain, DOE, palpitations, LE edema, orthopnea, PND. Amanda Dawson has been complaint with her meds and denies any side effects. No muscle pain, no bruising.   Current Meds  Medication Sig  . AMBULATORY NON FORMULARY MEDICATION Take 90 mg by mouth 2 (two) times daily. Medication Name: Brilinta 90 mg BID (TWILIGHT research study PROVIDED)  . AMBULATORY NON FORMULARY MEDICATION Take 81 mg by mouth daily. Medication Name:Aspirin 81 mg vs a placebo, TWILIGHT Research Study drug provided  . atorvastatin (LIPITOR) 40 MG tablet Take 1 tablet (40 mg total) by mouth daily.  . calcium citrate-vitamin D (CITRACAL+D) 315-200 MG-UNIT tablet Take 1 tablet by mouth daily.  . carvedilol (COREG) 3.125 MG tablet Take 1 tablet (3.125 mg total) by mouth 2 (two) times daily with a meal.  . Cholecalciferol (VITAMIN D3) 5000 units TABS Take 1 capsule by mouth daily.  Marland Kitchen lisinopril (PRINIVIL,ZESTRIL) 20 MG tablet Take 1 tablet (20 mg total) by mouth daily.  . nitroGLYCERIN (NITROSTAT) 0.4 MG SL tablet Place 1 tablet (0.4 mg total) under the tongue every 5 (five) minutes x 3 doses as needed for chest pain.  Marland Kitchen omeprazole (PRILOSEC) 20 MG capsule Take 20 mg by mouth daily.  Marland Kitchen venlafaxine XR (EFFEXOR-XR) 75 MG 24 hr capsule Take 75 mg by mouth daily.  . [DISCONTINUED] atorvastatin (LIPITOR) 40 MG tablet Take 40 mg by mouth daily.  . [DISCONTINUED] carvedilol (COREG) 3.125  MG tablet Take 1 tablet (3.125 mg total) by mouth 2 (two) times daily with a meal.  . [DISCONTINUED] lisinopril (PRINIVIL,ZESTRIL) 20 MG tablet Take 20 mg by mouth daily.   Allergies  Allergen Reactions  . Nsaids     Renal insufficiency   Past Medical History:  Diagnosis Date  . Coronary artery disease    a. 10/17 PCI with DES to mid LAD, Ist mrg, and Mid RCA, EF 35-40%  . GERD (gastroesophageal reflux disease)   . High cholesterol   . Hypertension   . NSTEMI (non-ST  elevated myocardial infarction) (HCC) 07/10/2016  . Obesity   . TGA (transient global amnesia)    "several times" (07/10/2016)   Family History  Problem Relation Age of Onset  . Hypertension Mother   . Heart disease Brother     Not sure what   Past Surgical History:  Procedure Laterality Date  . CARDIAC CATHETERIZATION N/A 07/10/2016   Procedure: Left Heart Cath and Coronary Angiography;  Surgeon: Corky Crafts, MD;  Location[facility] INVASIVE CV LAB;  Service: Cardiovascular;  Laterality: N/A;  . CARDIAC CATHETERIZATION N/A 07/10/2016   Procedure: Coronary Stent Intervention;  Surgeon: Corky Crafts, MD;  [facility]VASIVE CV LAB;  Service: Cardiovascular;  Laterality: N/A;  . CORONARY ANGIOPLASTY WITH STENT PLACEMENT  07/10/2016   Social History   Social History  . Marital status: Married    Spouse name: N/A  . Number of children: N/A  . Years of education: N/A   Occupational History  . Not on file.   Social History Main Topics  . Smoking status: Never Smoker  . Smokeless tobacco: Never Used  . Alcohol use 7.2 oz/week    2 Glasses of wine, 10 Shots of liquor per week  . Drug use: No  . Sexual activity: Yes   Other Topics Concern  . Not on file   Social History Narrative  . No narrative on file     Review of Systems: General: negative for chills, fever, night sweats or weight changes.  Cardiovascular: negative for chest pain, dyspnea on exertion, edema, orthopnea, palpitations, paroxysmal nocturnal dyspnea or shortness of breath Dermatological: negative for rash Respiratory: negative for cough or wheezing Urologic: negative for hematuria Abdominal: negative for nausea, vomiting, diarrhea, bright red blood per rectum, melena, or hematemesis Neurologic: negative for visual changes, syncope, or dizziness All other systems reviewed and are otherwise negative except as noted above.   Physical Exam:  Blood pressure 128/72, pulse 70, height 5\' 1"  (1.549  m), weight 145 lb (65.8 kg), SpO2 98 %.  General appearance: alert, cooperative and no distress Neck: no carotid bruit and no JVD Lungs: clear to auscultation bilaterally Heart: regular rate and rhythm, S1, S2 normal, no murmur, click, rub or gallop Extremities: extremities normal, atraumatic, no cyanosis or edema Pulses: 2+ and symmetric Skin: Skin color, texture, turgor normal. No rashes or lesions Neurologic: Grossly normal  EKG NSR. 65 bpm. No ischemia.    ASSESSMENT AND PLAN:   1. CAD: s/p NSTEMI in 06/2016 secondary to 3V obstructive disease with LHC showing 99% stenosis in mid LAD that was stented, 80% stenosis of the first marginal branch that was stented, and 95% mid RCA lesion that was stented as well. EF 35-40% by echo. Amanda Dawson is stable w/o recurrent CP. NYHA I. Repeat echo in January 2018 showed that her LVEF has recovered from 35-40% to 55-60%.  Continue DAPT with ASA + Brilinta (Twilight study), BB, statin + ACE-I. Amanda Dawson  has PRN SL NTG in case of recurrent angina.   2. Ischemic CMP - Systolic HF: EF post NSTEMI was 35-40%-> improved to 55-60% in January 2018. NYHA I, advised to continue exercising. Continue lisinopril and carvedilol.  3. HTN: well controlled on current regimen. Continue Coreg and lisinopril.   4. HLD: recent lipid panel showed LDL level at 105 -->20 mg/dL. Atorvastatin was decreased to 40 mg po daily.  5. H/o Hormone Replacement Therapy: estradiol and progesterone recently discontinued by her gynecologist.    PLAN  F/u with Dr. Delton SeeNelson in 6 months. Check CMP and CBC today.  Tobias AlexanderKatarina Tilly Pernice , MD 12/17/2016 5:11 PM

## 2016-12-15 LAB — COMPREHENSIVE METABOLIC PANEL WITH GFR
ALT: 32 [IU]/L (ref 0–32)
AST: 23 [IU]/L (ref 0–40)
Albumin/Globulin Ratio: 2.3 — ABNORMAL HIGH (ref 1.2–2.2)
Albumin: 4.5 g/dL (ref 3.6–4.8)
Alkaline Phosphatase: 93 [IU]/L (ref 39–117)
BUN/Creatinine Ratio: 17 (ref 12–28)
BUN: 16 mg/dL (ref 8–27)
Bilirubin Total: 1.1 mg/dL (ref 0.0–1.2)
CO2: 24 mmol/L (ref 18–29)
Calcium: 9.7 mg/dL (ref 8.7–10.3)
Chloride: 104 mmol/L (ref 96–106)
Creatinine, Ser: 0.93 mg/dL (ref 0.57–1.00)
GFR calc Af Amer: 76 mL/min/{1.73_m2}
GFR calc non Af Amer: 66 mL/min/{1.73_m2}
Globulin, Total: 2 g/dL (ref 1.5–4.5)
Glucose: 89 mg/dL (ref 65–99)
Potassium: 4.1 mmol/L (ref 3.5–5.2)
Sodium: 145 mmol/L — ABNORMAL HIGH (ref 134–144)
Total Protein: 6.5 g/dL (ref 6.0–8.5)

## 2016-12-15 LAB — CBC WITH DIFFERENTIAL/PLATELET
Basophils Absolute: 0 10*3/uL (ref 0.0–0.2)
Basos: 0 %
EOS (ABSOLUTE): 0.1 10*3/uL (ref 0.0–0.4)
Eos: 1 %
Hematocrit: 42.2 % (ref 34.0–46.6)
Hemoglobin: 14.4 g/dL (ref 11.1–15.9)
Immature Grans (Abs): 0 10*3/uL (ref 0.0–0.1)
Immature Granulocytes: 0 %
Lymphocytes Absolute: 1.2 10*3/uL (ref 0.7–3.1)
Lymphs: 24 %
MCH: 31.8 pg (ref 26.6–33.0)
MCHC: 34.1 g/dL (ref 31.5–35.7)
MCV: 93 fL (ref 79–97)
Monocytes Absolute: 0.4 10*3/uL (ref 0.1–0.9)
Monocytes: 8 %
Neutrophils Absolute: 3.3 10*3/uL (ref 1.4–7.0)
Neutrophils: 67 %
Platelets: 242 10*3/uL (ref 150–379)
RBC: 4.53 x10E6/uL (ref 3.77–5.28)
RDW: 13.2 % (ref 12.3–15.4)
WBC: 5 10*3/uL (ref 3.4–10.8)

## 2017-04-06 ENCOUNTER — Encounter: Payer: Self-pay | Admitting: *Deleted

## 2017-04-06 DIAGNOSIS — Z006 Encounter for examination for normal comparison and control in clinical research program: Secondary | ICD-10-CM

## 2017-04-06 NOTE — Progress Notes (Signed)
Pt here today for 9 month follow up. No complaints of bleeding, chest pain, or bruising. She was 102% on study drug and 97% on Brilinta. She has not taken any ASA other then her ASA/Placebo drug. Gave her new bottles of meds.  Brilinta bottle number [id number] and [id number]. Study drug bottle number is [id number]. Will see her back in December for 15 month follow up and at that time the MD will decide what meds she will continue on or start on.  Mercer PodLUTTERLOH, Dyllin Gulley D, RN 04/06/17

## 2017-07-05 IMAGING — DX DG CHEST 2V
2 series · 2 of 2 positions shown · non-contrast
Comparison: No recent prior .

CLINICAL DATA: Chest pain.

EXAM:
CHEST  2 VIEW

[w chest pa]
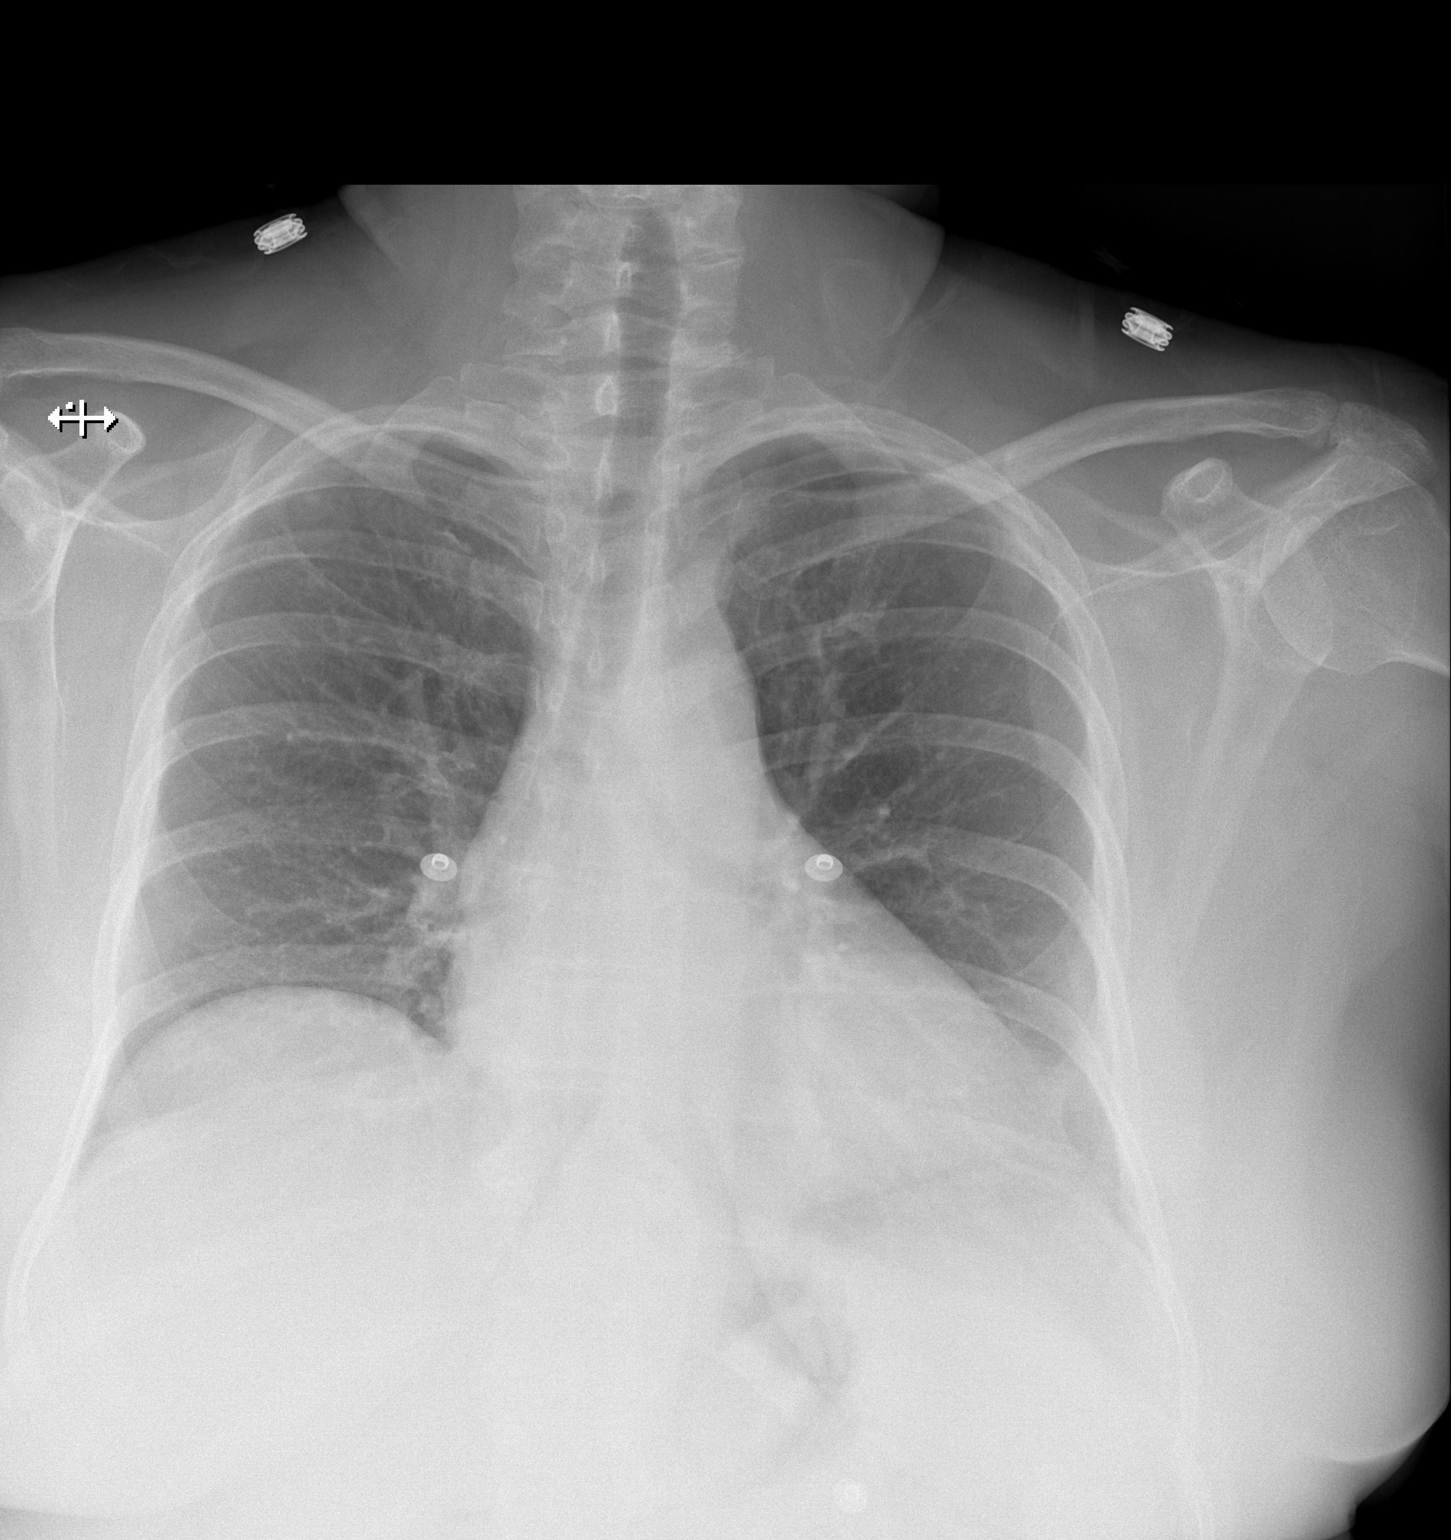

[w chest lat]
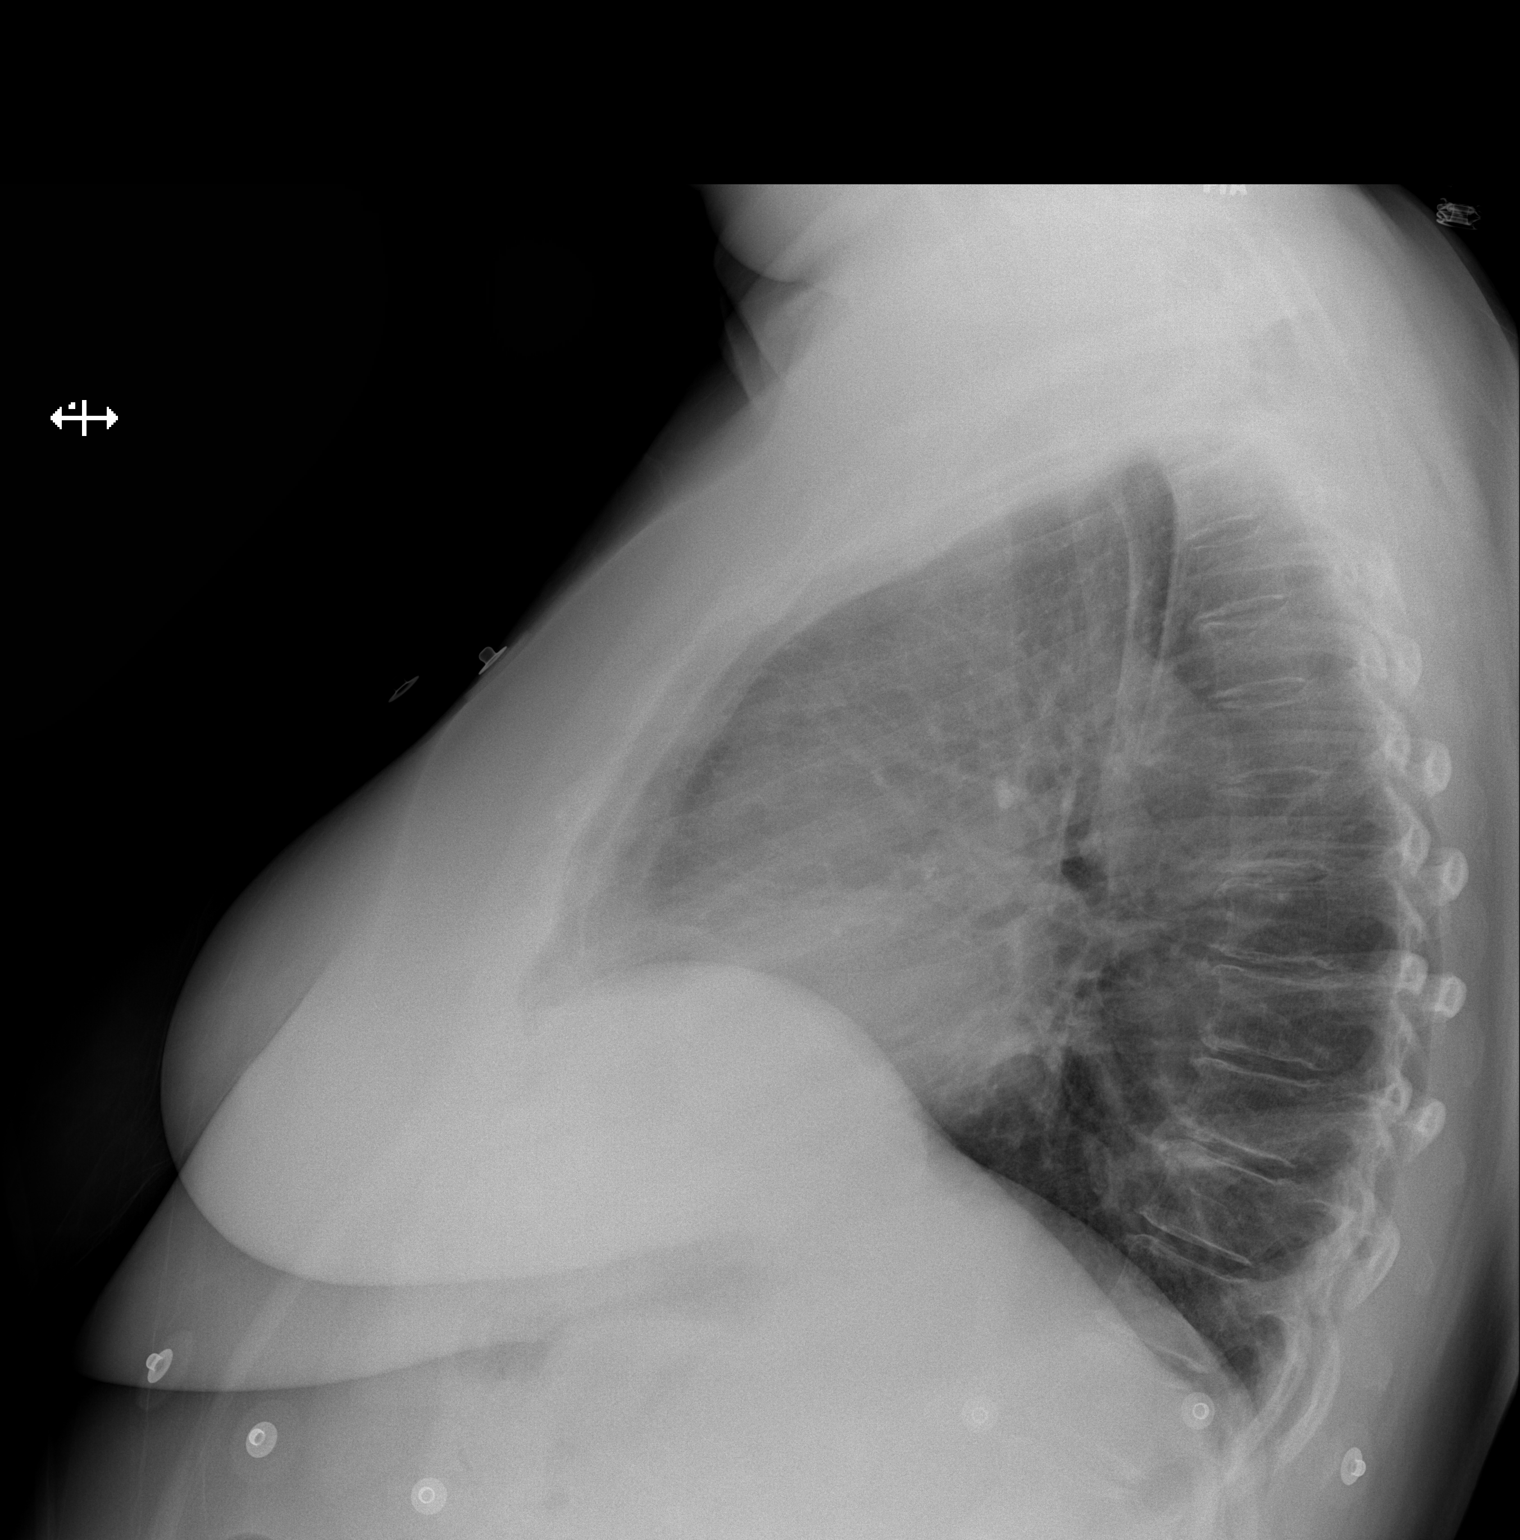

[2 of 2 positions shown; findings below may reference images not displayed]

FINDINGS: Mediastinum and hilar structures normal. Low lung volumes. No
pleural effusion or pneumothorax. Borderline cardiomegaly with
normal pulmonary vascularity. No acute bony abnormality .
IMPRESSION: Low lung volumes. Borderline cardiomegaly. No pulmonary venous
congestion .

## 2017-08-06 ENCOUNTER — Ambulatory Visit: Payer: BLUE CROSS/BLUE SHIELD | Admitting: Cardiology

## 2017-08-06 ENCOUNTER — Encounter: Payer: Self-pay | Admitting: Cardiology

## 2017-08-06 VITALS — BP 130/80 | HR 61 | Ht 61.0 in | Wt 150.0 lb

## 2017-08-06 DIAGNOSIS — E785 Hyperlipidemia, unspecified: Secondary | ICD-10-CM

## 2017-08-06 DIAGNOSIS — I2510 Atherosclerotic heart disease of native coronary artery without angina pectoris: Secondary | ICD-10-CM

## 2017-08-06 DIAGNOSIS — I214 Non-ST elevation (NSTEMI) myocardial infarction: Secondary | ICD-10-CM | POA: Diagnosis not present

## 2017-08-06 DIAGNOSIS — I10 Essential (primary) hypertension: Secondary | ICD-10-CM

## 2017-08-06 MED ORDER — LISINOPRIL 20 MG PO TABS: 20.0000 mg | ORAL_TABLET | Freq: Every day | ORAL | 3 refills | Status: AC

## 2017-08-06 MED ORDER — CARVEDILOL 3.125 MG PO TABS: 3.1250 mg | ORAL_TABLET | Freq: Two times a day (BID) | ORAL | 3 refills | Status: DC

## 2017-08-06 MED ORDER — ATORVASTATIN CALCIUM 40 MG PO TABS: 40.0000 mg | ORAL_TABLET | Freq: Every day | ORAL | 3 refills | Status: DC

## 2017-08-06 NOTE — Patient Instructions (Signed)

## 2017-08-06 NOTE — Progress Notes (Addendum)
12/15/2016 Amanda Dawson   [birth year]  [id number]  Primary Physician [name]., MD Primary Cardiologist: Dr. Delton See   Reason for Visit/CC: [facility] F/u for CAD s/p NSTEMI; Systolic HF   HPI:  61 year old female with h/o NSTEMI in 06/2016. She was postmenopausal female who had been on Estrogen therapy, also with a h/o THN and obesity, who presented to [facility] on 07/10/16 with a CC of SSCP. Symptoms were relieved in the ED with SL NTG. Given her symptomology, she was admitted and ruled in for NSTEMI with positive enzymes. She underwent LHC with Dr. Eldridge Dace showing showed decreased LV ejection fraction of 35-45% on ventriculogram, she also had three-vessel disease with 99% stenosis in mid LAD that was stented, 80% stenosis of the first marginal branch that was stented, and 95% mid RCA lesion that was stented as well. Her follow up Echo showed EF of 35-40% with akinesis of the mid-apicalanteroseptal and apical myocardium, along with G1DD. She decided to enroll in the Twilight study and will continue on ASA and Brilinta for at least one year. This admission she was also started on atorvastatin 80mg , low dose coreg and her home lisinopril was continued. Patient does take home estradiol and progesterone. It was asked that the patient  make a follow up appt with her OB/GYN to discuss reducing or stopping these given her recent findings of CAD.  09/25/2016 - patient is coming after 2 months, she feels that she is fully recovered, she has a lot of energy, she denies any chest pain shortness of breath on exertion , she has returned to work just 2 weeks after her NSTEMI and was able to work full time.  She denies any lower extremity edema orthopnea proximal nocturnal dyspnea no palpitations or syncope. He has been fully compliant to her meds and has no side effects with any of them other than easy bruising.   08/06/2017 - this is 6 months follow-up, the patient states that she is feeling great, she has  no chest pain or shortness of breath. She continues to work and is able to do activities of daily living without any limitations. She's been compliant medications and has no side effects except for easy bruising when she injures herself.   Current Meds  Medication Sig  . AMBULATORY NON FORMULARY MEDICATION Take 90 mg by mouth 2 (two) times daily. Medication Name: Brilinta 90 mg BID (TWILIGHT research study PROVIDED)  . AMBULATORY NON FORMULARY MEDICATION Take 81 mg by mouth daily. Medication Name:Aspirin 81 mg vs a placebo, TWILIGHT Research Study drug provided  . atorvastatin (LIPITOR) 40 MG tablet Take 1 tablet (40 mg total) by mouth daily.  . calcium citrate-vitamin D (CITRACAL+D) 315-200 MG-UNIT tablet Take 1 tablet by mouth daily.  . carvedilol (COREG) 3.125 MG tablet Take 1 tablet (3.125 mg total) by mouth 2 (two) times daily with a meal.  . Cholecalciferol (VITAMIN D3) 5000 units TABS Take 1 capsule by mouth daily.  Marland Kitchen lisinopril (PRINIVIL,ZESTRIL) 20 MG tablet Take 1 tablet (20 mg total) by mouth daily.  . nitroGLYCERIN (NITROSTAT) 0.4 MG SL tablet Place 1 tablet (0.4 mg total) under the tongue every 5 (five) minutes x 3 doses as needed for chest pain.  Marland Kitchen omeprazole (PRILOSEC) 20 MG capsule Take 20 mg by mouth daily.  Marland Kitchen venlafaxine XR (EFFEXOR-XR) 75 MG 24 hr capsule Take 75 mg by mouth daily.   Allergies  Allergen Reactions  . Nsaids     Renal insufficiency   Past  Medical History:  Diagnosis Date  . Coronary artery disease    a. 10/17 PCI with DES to mid LAD, Ist mrg, and Mid RCA, EF 35-40%  . GERD (gastroesophageal reflux disease)   . High cholesterol   . Hypertension   . NSTEMI (non-ST elevated myocardial infarction) (HCC) 07/10/2016  . Obesity   . TGA (transient global amnesia)    "several times" (07/10/2016)   Family History  Problem Relation Age of Onset  . Hypertension Mother   . Heart disease Brother        Not sure what   Past Surgical History:  Procedure  Laterality Date  . CORONARY ANGIOPLASTY WITH STENT PLACEMENT  07/10/2016  . Coronary Stent Intervention N/A 07/10/2016   Performed by Corky CraftsVaranasi, Jayadeep S[facility]  . Left Heart Cath and Coronary Angiography N/A 07/10/2016   Performed by Corky CraftsVaranasi, Jayadeep S[facility]   Social History   Socioeconomic History  . Marital status: Married    Spouse name: Not on file  . Number of children: Not on file  . Years of education: Not on file  . Highest education level: Not on file  Social Needs  . Financial resource strain: Not on file  . Food insecurity - worry: Not on file  . Food insecurity - inability: Not on file  . Transportation needs - medical: Not on file  . Transportation needs - non-medical: Not on file  Occupational History  . Not on file  Tobacco Use  . Smoking status: Never Smoker  . Smokeless tobacco: Never Used  Substance and Sexual Activity  . Alcohol use: Yes    Alcohol/week: 7.2 oz    Types: 2 Glasses of wine, 10 Shots of liquor per week  . Drug use: No  . Sexual activity: Yes  Other Topics Concern  . Not on file  Social History Narrative  . Not on file     Review of Systems: General: negative for chills, fever, night sweats or weight changes.  Cardiovascular: negative for chest pain, dyspnea on exertion, edema, orthopnea, palpitations, paroxysmal nocturnal dyspnea or shortness of breath Dermatological: negative for rash Respiratory: negative for cough or wheezing Urologic: negative for hematuria Abdominal: negative for nausea, vomiting, diarrhea, bright red blood per rectum, melena, or hematemesis Neurologic: negative for visual changes, syncope, or dizziness All other systems reviewed and are otherwise negative except as noted above.  Physical Exam:  Blood pressure 130/80, pulse 61, height 5\' 1"  (1.549 m), weight 150 lb (68 kg), SpO2 99 %.  General appearance: alert, cooperative and no distress Neck: loud left carotid bruit  and no JVD Lungs: clear to auscultation bilaterally Heart: regular rate and rhythm, S1, S2 normal, no murmur, click, rub or gallop Extremities: extremities normal, atraumatic, no cyanosis or edema Pulses: 2+ and symmetric Skin: Skin color, texture, turgor normal. No rashes or lesions Neurologic: Grossly normal  EKG PERSONALLY REVIEWED, SHOWS SINUS RHYTHM WITH FIRST-DEGREE AV BLOCK OTHERWISE NORMAL UNCHANGED FROM PRIOR.    ASSESSMENT AND PLAN:   1. CAD: s/p NSTEMI in 06/2016 secondary to 3V obstructive disease with LHC showing 99% stenosis in mid LAD that was stented, 80% stenosis of the first marginal branch that was stented, and 95% mid RCA lesion that was stented as well. EF 35-40% by echo. NYHA I. Repeat echo in January 2018 showed that her LVEF has recovered to 55-60%.  Continue DAPT with ASA + Brilinta (Twilight study), BB, statin + ACE-I. She  has PRN SL NTG in case of recurrent angina. She is advised to call us if her bruising gets worse or Shields any bleeding.  2. Ischemic CMP - Systolic HF: EF post NSTEMI was 35-40%-> improved to 55-60% in January 2018. NYHA I, she is strongly encouraged to start regular exercise.   3. HTN: well controlled on current regimen. Continue Coreg and lisinopril.   4. HLD: recent lipid panel showed LDL level at 105 -->20 mg/dL. Atorvastatin was decreased to 40 mg po daily. Most recent LDL 44. Her HDL is 45, she is advised to increase her exercise level. Her triglycerides are 186 but that improvement from 281.  5. H/o Hormone Replacement Therapy: estradiol and progesterone recently discontinued by her gynecologist.   PLAN  F/u with Dr. Delton SeeNelson in 6 months.   Tobias AlexanderKatarina Andalyn Heckstall , MD 08/06/2017 8:37 AM

## 2017-09-28 ENCOUNTER — Telehealth: Payer: Self-pay | Admitting: *Deleted

## 2017-09-28 NOTE — Telephone Encounter (Signed)
Lars MassonNelson, Katarina H, MD  Loa SocksMartin, Gelsey Amyx M, LPN        Lajoyce CornersIvy,  Please prescribe aspirin and Brilinta and if she cant afford it then ASA and Plavix.  KN   Previous Messages    ----- Message -----  From: Orrin BrighamHedrick, Tammy W, RN  Sent: 09/27/2017  4:16 PM  To: Lars MassonKatarina H Nelson, MD  Subject: End of TWILIGHT Research study          Dr. Delton SeeNelson   Patient has a research appointment 10/19/17. She will no longer receive ASA/Placebo or Brilinta.   Further antiplatelet therapy (Including ASA ) will be at your discretion.  Please let me know your plans and I can send in a script (if applicable) and educate patient.   Thanks  McKessonammy

## 2017-09-28 NOTE — Telephone Encounter (Signed)
Spoke with the pt and informed her of Dr Lindaann SloughNelson's recommendations of meds, once her TWILIGHT research study is complete on 10/19/17.  Pt states that she will either call us after her 2/1 appt with research and have us call in antiplatelet therapy meds, or Tammy RN with [facility] will send in new scripts at that office visit.  Advised the pt that if Tammy is unable to send in scripts, then she should call our office back and we'll be glad to assist with this.  Pt verbalized understanding and agrees with this plan. Will route this message to Amelia Joammy Hedrick RN with research, for follow-up antiplatelet scripts, when she see's the pt on 10/19/17.

## 2017-10-01 NOTE — Telephone Encounter (Signed)
Per Tammy RN with Research, she will endorse med changes at the next OV with the pt on 10/19/17.

## 2017-10-19 ENCOUNTER — Encounter: Payer: BLUE CROSS/BLUE SHIELD | Admitting: *Deleted

## 2017-10-19 DIAGNOSIS — Z006 Encounter for examination for normal comparison and control in clinical research program: Secondary | ICD-10-CM

## 2017-10-19 MED ORDER — ASPIRIN EC 81 MG PO TBEC: 81.0000 mg | DELAYED_RELEASE_TABLET | Freq: Every day | ORAL | 3 refills | Status: AC

## 2017-10-19 MED ORDER — TICAGRELOR 60 MG PO TABS: 90.0000 mg | ORAL_TABLET | Freq: Two times a day (BID) | ORAL | Status: DC

## 2017-10-19 NOTE — Progress Notes (Signed)
TWILIGHT Research study 15 month follow up visit completed. Patient denies any adverse bleeding events. She has been 96 % compliant with ASA/Placebo and 94 % compliant with brilinta. Dr. Delton SeeNelson has ordered her to start open label ASA 81 mg  Daily and Brilinta 90 mg BID. I will makes sure [facility] [address] I have 1 remaining follow up due between 2/APR/19-30/APR/19. I thanked her for her participation.

## 2017-10-22 ENCOUNTER — Telehealth: Payer: Self-pay | Admitting: *Deleted

## 2017-10-22 MED ORDER — TICAGRELOR 90 MG PO TABS: 90.0000 mg | ORAL_TABLET | Freq: Two times a day (BID) | ORAL | 3 refills | Status: DC

## 2017-10-22 NOTE — Telephone Encounter (Signed)
Medication ordered

## 2017-12-18 ENCOUNTER — Encounter: Payer: Self-pay | Admitting: *Deleted

## 2017-12-18 DIAGNOSIS — Z006 Encounter for examination for normal comparison and control in clinical research program: Secondary | ICD-10-CM

## 2017-12-18 NOTE — Progress Notes (Signed)
TWILIGHT Research study 18 month telephone follow up completed. Patient continues on Brilinta (0 mg BID and ASA 81 mg daily. She denies any bleeding events or interruptions with medication. I thanked her for her participation and informed her the research office would let her know the results of the study and if she was taking ASA or Placebo hopefully by the end of the year.

## 2018-02-06 ENCOUNTER — Other Ambulatory Visit: Payer: Self-pay | Admitting: Cardiology

## 2018-02-20 ENCOUNTER — Other Ambulatory Visit: Payer: Self-pay | Admitting: Cardiology

## 2018-02-20 MED ORDER — TICAGRELOR 90 MG PO TABS: ORAL_TABLET | ORAL | 2 refills | Status: DC

## 2018-03-18 ENCOUNTER — Ambulatory Visit: Payer: BLUE CROSS/BLUE SHIELD | Admitting: Cardiology

## 2018-03-18 ENCOUNTER — Encounter

## 2018-03-20 ENCOUNTER — Ambulatory Visit: Payer: BLUE CROSS/BLUE SHIELD | Admitting: Cardiology

## 2018-03-20 ENCOUNTER — Other Ambulatory Visit: Payer: Self-pay | Admitting: Cardiology

## 2018-03-20 ENCOUNTER — Encounter: Payer: Self-pay | Admitting: Cardiology

## 2018-03-20 VITALS — BP 130/70 | HR 69 | Ht 61.0 in | Wt 150.8 lb

## 2018-03-20 DIAGNOSIS — I739 Peripheral vascular disease, unspecified: Secondary | ICD-10-CM

## 2018-03-20 DIAGNOSIS — E785 Hyperlipidemia, unspecified: Secondary | ICD-10-CM

## 2018-03-20 DIAGNOSIS — I10 Essential (primary) hypertension: Secondary | ICD-10-CM | POA: Diagnosis not present

## 2018-03-20 DIAGNOSIS — I2510 Atherosclerotic heart disease of native coronary artery without angina pectoris: Secondary | ICD-10-CM

## 2018-03-20 NOTE — Progress Notes (Signed)
12/15/2016 Amanda Dawson   18-Jul-1956  [id number]  Primary Physician [name]., MD Primary Cardiologist: Dr. Delton See   Reason for Visit/CC: 8 months follow up  HPI:  62 year old female with h/o NSTEMI in 06/2016. She was postmenopausal female who had been on Estrogen therapy, also with a h/o THN and obesity, who presented to [facility] on 07/10/16 with a CC of SSCP. Symptoms were relieved in the ED with SL NTG. Given her symptomology, she was admitted and ruled in for NSTEMI with positive enzymes. She underwent LHC with Dr. Eldridge Dace showing showed decreased LV ejection fraction of 35-45% on ventriculogram, she also had three-vessel disease with 99% stenosis in mid LAD that was stented, 80% stenosis of the first marginal branch that was stented, and 95% mid RCA lesion that was stented as well. Her follow up Echo showed EF of 35-40% with akinesis of the mid-apicalanteroseptal and apical myocardium, along with G1DD. She decided to enroll in the Twilight study and will continue on ASA and Brilinta for at least one year. This admission she was also started on atorvastatin 80mg , low dose coreg and her home lisinopril was continued. Patient does take home estradiol and progesterone. It was asked that the patient  make a follow up appt with her OB/GYN to discuss reducing or stopping these given her recent findings of CAD.  09/25/2016 - patient is coming after 2 months, she feels that she is fully recovered, she has a lot of energy, she denies any chest pain shortness of breath on exertion , she has returned to work just 2 weeks after her NSTEMI and was able to work full time.  She denies any lower extremity edema orthopnea proximal nocturnal dyspnea no palpitations or syncope. He has been fully compliant to her meds and has no side effects with any of them other than easy bruising.   08/06/2017 - this is 6 months follow-up, the patient states that she is feeling great, she has no chest pain or shortness of  breath. She continues to work and is able to do activities of daily living without any limitations. She's been compliant medications and has no side effects except for easy bruising when she injures herself.  03/20/2018 -patient is coming after 8 months, she is feeling great, she denies any chest pain, shortness of breath, palpitations, no lower extremity edema orthopnea proximal nocturnal dyspnea.  She has been noticing leg pain especially in her left leg predominantly at night and sometimes when she is walking.  She does not exercise as much as she wishes to, she has a gym at work but does not use it.  She is trying to walk several times a week but because of heat has not been doing it lately.   Current Meds  Medication Sig  . aspirin EC 81 MG tablet Take 1 tablet (81 mg total) by mouth daily.  Marland Kitchen atorvastatin (LIPITOR) 40 MG tablet Take 1 tablet (40 mg total) daily by mouth.  . calcium citrate-vitamin D (CITRACAL+D) 315-200 MG-UNIT tablet Take 1 tablet by mouth daily.  . carvedilol (COREG) 3.125 MG tablet Take 1 tablet (3.125 mg total) 2 (two) times daily with a meal by mouth.  . Cholecalciferol (VITAMIN D3) 5000 units TABS Take 1 capsule by mouth daily.  Marland Kitchen lisinopril (PRINIVIL,ZESTRIL) 20 MG tablet Take 1 tablet (20 mg total) daily by mouth.  . nitroGLYCERIN (NITROSTAT) 0.4 MG SL tablet Place 1 tablet (0.4 mg total) under the tongue every 5 (five) minutes x 3  doses as needed for chest pain.  Marland Kitchen omeprazole (PRILOSEC) 20 MG capsule Take 20 mg by mouth daily.  . ticagrelor (BRILINTA) 90 MG TABS tablet TAKE 1 TABLET (90 MG TOTAL) BY MOUTH 2 (TWO) TIMES DAILY. Please keep upcoming appt in July. Thank you  . venlafaxine XR (EFFEXOR-XR) 75 MG 24 hr capsule Take 75 mg by mouth daily.   Allergies  Allergen Reactions  . Nsaids     Renal insufficiency   Past Medical History:  Diagnosis Date  . Anxiety 09/25/2016  . CAD (coronary artery disease), native coronary artery 07/11/2016   Overview:  Got left  heart cath and stents on 07/10/16 by Dr Lance Muss at [facility] (3 vessel dz:  Mid LAD lesion 99%, first marginal 80%, and mid RCA 95%).  EF 35 to 45% by visual estimate  Overview:  Got left heart cath and stents on 07/10/16 by Dr Lance Muss at [facility] (3 vessel dz:  Mid LAD lesion 99%, first marginal 80%, and mid RCA 95%).  EF 35 to 45% by visual estimate  . Cardiomyopathy, ischemic 09/25/2016  . Coronary artery disease    a. 10/17 PCI with DES to mid LAD, Ist mrg, and Mid RCA, EF 35-40%  . Essential hypertension   . Gastroesophageal reflux disease without esophagitis 11/17/2014  . GERD (gastroesophageal reflux disease)   . High cholesterol   . Hormone replacement therapy (HRT) 07/03/2016  . Hyperlipidemia LDL goal <100 07/11/2016   Overview:  Pt's cardiologist is managing and monitoring  . Hypertension   . NSTEMI (non-ST elevated myocardial infarction) (HCC) 07/10/2016  . Obesity   . Seizures (HCC) 09/25/2016   Overview:  [place] amnesia  Overview:  [employer] amnesia  . Symptomatic menopausal or female climacteric states 11/17/2014   Overview:  Managed by GYN  . TGA (transient global amnesia)    "several times" (07/10/2016)   Family History  Problem Relation Age of Onset  . Hypertension Mother   . Heart disease Brother        Not sure what   Past Surgical History:  Procedure Laterality Date  . CARDIAC CATHETERIZATION N/A 07/10/2016   Procedure: Left Heart Cath and Coronary Angiography;  Surgeon: Corky Crafts, MD;  Location: [facility] INVASIVE CV LAB;  Service: Cardiovascular;  Laterality: N/A;  . CARDIAC CATHETERIZATION N/A 07/10/2016   Procedure: Coronary Stent Intervention;  Surgeon: Corky Crafts, MD;  Location: [facility] INVASIVE CV LAB;  Service: Cardiovascular;  Laterality: N/A;  . CORONARY ANGIOPLASTY WITH STENT PLACEMENT  07/10/2016   Social History   Socioeconomic History  . Marital status: Married    Spouse name: Not on file  . Number of children: Not on file    . Years of education: Not on file  . Highest education level: Not on file  Occupational History  . Not on file  Social Needs  . Financial resource strain: Not on file  . Food insecurity:    Worry: Not on file    Inability: Not on file  . Transportation needs:    Medical: Not on file    Non-medical: Not on file  Tobacco Use  . Smoking status: Never Smoker  . Smokeless tobacco: Never Used  Substance and Sexual Activity  . Alcohol use: Yes    Alcohol/week: 7.2 oz    Types: 2 Glasses of wine, 10 Shots of liquor per week  . Drug use: No  . Sexual activity: Yes  Lifestyle  . Physical activity:    Days per week:  Not on file    Minutes per session: Not on file  . Stress: Not on file  Relationships  . Social connections:    Talks on phone: Not on file    Gets together: Not on file    Attends religious service: Not on file    Active member of club or organization: Not on file    Attends meetings of clubs or organizations: Not on file    Relationship status: Not on file  . Intimate partner violence:    Fear of current or ex partner: Not on file    Emotionally abused: Not on file    Physically abused: Not on file    Forced sexual activity: Not on file  Other Topics Concern  . Not on file  Social History Narrative  . Not on file    Review of Systems: General: negative for chills, fever, night sweats or weight changes.  Cardiovascular: negative for chest pain, dyspnea on exertion, edema, orthopnea, palpitations, paroxysmal nocturnal dyspnea or shortness of breath Dermatological: negative for rash Respiratory: negative for cough or wheezing Urologic: negative for hematuria Abdominal: negative for nausea, vomiting, diarrhea, bright red blood per rectum, melena, or hematemesis Neurologic: negative for visual changes, syncope, or dizziness All other systems reviewed and are otherwise negative except as noted above.  Physical Exam:  Blood pressure 130/70, pulse 69, height 5\' 1"   (1.549 m), weight 150 lb 12.8 oz (68.4 kg), SpO2 97 %.  General appearance: alert, cooperative and no distress Neck: loud left carotid bruit and no JVD Lungs: clear to auscultation bilaterally Heart: regular rate and rhythm, S1, S2 normal, no murmur, click, rub or gallop Extremities: extremities normal, atraumatic, no cyanosis or edema Pulses: 2+ and symmetric Skin: Skin color, texture, turgor normal. No rashes or lesions Neurologic: Grossly normal  EKG : 03/20/18 - SR, normal ECG unchanged from prior.     ASSESSMENT AND PLAN:   1. CAD: s/p NSTEMI in 06/2016 secondary to 3V obstructive disease with LHC showing 99% stenosis in mid LAD that was stented, 80% stenosis of the first marginal branch that was stented, and 95% mid RCA lesion that was stented as well. EF 35-40% by echo. NYHA I. Repeat echo in January 2018 showed that her LVEF has recovered to 55-60%.  Continue DAPT with ASA + Brilinta, she has now completed Twilight study, she was not getting Brilinta, but now restarted on 90 mg p.o. twice daily.  She denies any bruising or bleeding.  We will continue atorvastatin 40 mg daily, Coreg 3.125 mg p.o. twice daily, lisinopril 20 mg daily and nitroglycerin as needed, so far she did not need to use it.  2. Ischemic CMP - Systolic HF: EF post NSTEMI was 35-40%-> improved to 55-60% in January 2018. NYHA I, she is strongly encouraged to start regular exercise.   3. HTN: well controlled on current regimen. Continue Coreg and lisinopril.   4. HLD: recent lipid panel showed LDL level at 105 -->20 mg/dL. Atorvastatin was decreased to 40 mg po daily. Most recent LDL 44. Her HDL is 45, she is advised to increase her exercise level. Her triglycerides are 186 but that improvement from 281.  Her most recent lipids checked by her PCP and all of the goal.  We will continue the same management with atorvastatin 40 mg daily that she is tolerating well.  5. H/o Hormone Replacement Therapy: estradiol and  progesterone recently discontinued by her gynecologist.   6.  Claudications - poor peripheral pulses bilaterally,  we will order B/L arterial Duplex  PLAN  F/u with Dr. Delton See in 1 year.  Tobias Alexander , MD 03/20/2018 8:36 AM

## 2018-03-20 NOTE — Patient Instructions (Signed)
Medication Instructions:   Your physician recommends that you continue on your current medications as directed. Please refer to the Current Medication list given to you today.    Testing/Procedures:  Your physician has requested that you have a lower extremity arterial duplex. This test is an ultrasound of the arteries in the legs or arms. It looks at arterial blood flow in the legs and arms. Allow one hour for Lower and Upper Arterial scans. There are no restrictions or special instructions    Follow-Up:  Your physician wants you to follow-up in: ONE YEAR WITH DR NELSON You will receive a reminder letter in the mail two months in advance. If you don't receive a letter, please call our office to schedule the follow-up appointment.       If you need a refill on your cardiac medications before your next appointment, please call your pharmacy.   

## 2018-03-29 ENCOUNTER — Ambulatory Visit (HOSPITAL_COMMUNITY)
Admission: RE | Admit: 2018-03-29 | Discharge: 2018-03-29 | Disposition: A | Discharge status: Home / Self Care | Payer: BLUE CROSS/BLUE SHIELD | Source: Ambulatory Visit | Attending: Cardiology | Admitting: Cardiology

## 2018-03-29 DIAGNOSIS — I739 Peripheral vascular disease, unspecified: Secondary | ICD-10-CM | POA: Insufficient documentation

## 2018-04-11 ENCOUNTER — Encounter (HOSPITAL_COMMUNITY): Payer: BLUE CROSS/BLUE SHIELD

## 2018-08-21 ENCOUNTER — Other Ambulatory Visit: Payer: Self-pay | Admitting: Cardiology

## 2018-08-21 DIAGNOSIS — I2510 Atherosclerotic heart disease of native coronary artery without angina pectoris: Secondary | ICD-10-CM

## 2018-10-07 ENCOUNTER — Other Ambulatory Visit: Payer: Self-pay | Admitting: Cardiology

## 2018-11-29 ENCOUNTER — Other Ambulatory Visit: Payer: Self-pay | Admitting: Cardiology

## 2019-03-28 ENCOUNTER — Telehealth: Payer: Self-pay | Admitting: Cardiology

## 2019-03-28 ENCOUNTER — Telehealth: Payer: Self-pay | Admitting: *Deleted

## 2019-03-28 ENCOUNTER — Encounter: Payer: Self-pay | Admitting: *Deleted

## 2019-03-28 NOTE — Telephone Encounter (Signed)
Virtual Visit Pre-Appointment Phone Call  "(Name), I am calling you today to discuss your upcoming appointment. We are currently trying to limit exposure to the virus that causes COVID-19 by seeing patients at home rather than in the office."  1. "What is the BEST phone number to call the day of the visit?" - include this in appointment notes-YES UPDATED  2. Do you have or have access to (through a family member/friend) a smartphone with video capability that we can use for your visit?" a. If yes - list this number in appt notes as cell (if different from BEST phone #) and list the appointment type as a VIDEO visit in appointment notes-YES UPDATED   3. Confirm consent - "In the setting of the current Covid19 crisis, you are scheduled for a ( video) visit with Dr Meda Coffee on 7/13 at [place] .  Just as we do with many in-office visits, in order for you to participate in this visit, we must obtain consent.  If you'd like, I can send this to your mychart (if signed up) or email for you to review.  Otherwise, I can obtain your verbal consent now.  All virtual visits are billed to your insurance company just like a normal visit would be.  By agreeing to a virtual visit, we'd like you to understand that the technology does not allow for your provider to perform an examination, and thus may limit your provider's ability to fully assess your condition. If your provider identifies any concerns that need to be evaluated in person, we will make arrangements to do so.  Finally, though the technology is pretty good, we cannot assure that it will always work on either your or our end, and in the setting of a video visit, we may have to convert it to a phone-only visit.  In either situation, we cannot ensure that we have a secure connection.  Are you willing to proceed?" STAFF: Did the patient verbally acknowledge consent to telehealth visit? Document YES/NO here: YES VERBAL CONSENT GIVEN AS WELL AS CONSENT SENT TO  [place] TO REVIEW  4. Advise patient to be prepared - "Two hours prior to your appointment, go ahead and check your blood pressure, pulse, oxygen saturation, and your weight (if you have the equipment to check those) and write them all down. When your visit starts, your provider will ask you for this information. If you have an Apple Watch or Kardia device, please plan to have heart rate information ready on the day of your appointment. Please have a pen and paper handy nearby the day of the visit as well."--YES PT WILL TAKE  5. Give patient instructions Doxy.me-YES PT AWARE   6. Inform patient they will receive a phone call 15 minutes prior to their appointment time (may be from unknown caller ID) so they should be prepared to answer--YES PT [place] NOTE  Amanda Dawson has been deemed a candidate for a follow-up tele-health visit to limit community exposure during the Covid-19 pandemic. I spoke with the patient via phone to ensure availability of phone/video source, confirm preferred email & phone number, and discuss instructions and expectations.  I reminded Amanda Dawson to be prepared with any vital sign and/or heart rhythm information that could potentially be obtained via home monitoring, at the time of her visit. I reminded Amanda Dawson to expect a phone call prior to her visit.  Amanda Alpha, LPN 01/26/2584 27:78 PM  IF USING  [email or web address].ME - The patient will receive a link just prior to their visit by text.--YES AWARE     FULL LENGTH CONSENT FOR TELE-HEALTH VISIT   I hereby voluntarily request, consent and authorize [facility] and its employed or contracted physicians, physician assistants, nurse practitioners or other licensed health care professionals (the Practitioner), to provide me with telemedicine health care services (the Services") as deemed necessary by the treating Practitioner. I acknowledge and consent to receive the Services by the  Practitioner via telemedicine. I understand that the telemedicine visit will involve communicating with the Practitioner through live audiovisual communication technology and the disclosure of certain medical information by electronic transmission. I acknowledge that I have been given the opportunity to request an in-person assessment or other available alternative prior to the telemedicine visit and am voluntarily participating in the telemedicine visit.  I understand that I have the right to withhold or withdraw my consent to the use of telemedicine in the course of my care at any time, without affecting my right to future care or treatment, and that the Practitioner or I may terminate the telemedicine visit at any time. I understand that I have the right to inspect all information obtained and/or recorded in the course of the telemedicine visit and may receive copies of available information for a reasonable fee.  I understand that some of the potential risks of receiving the Services via telemedicine include:   Delay or interruption in medical evaluation due to technological equipment failure or disruption;  Information transmitted may not be sufficient (e.g. poor resolution of images) to allow for appropriate medical decision making by the Practitioner; and/or   In rare instances, security protocols could fail, causing a breach of personal health information.  Furthermore, I acknowledge that it is my responsibility to provide information about my medical history, conditions and care that is complete and accurate to the best of my ability. I acknowledge that Practitioner's advice, recommendations, and/or decision may be based on factors not within their control, such as incomplete or inaccurate data provided by me or distortions of diagnostic images or specimens that may result from electronic transmissions. I understand that the practice of medicine is not an exact science and that Practitioner makes  no warranties or guarantees regarding treatment outcomes. I acknowledge that I will receive a copy of this consent concurrently upon execution via email to the email address I last provided but may also request a printed copy by calling the office of [facility].    I understand that my insurance will be billed for this visit.   I have read or had this consent read to me.  I understand the contents of this consent, which adequately explains the benefits and risks of the Services being provided via telemedicine.   I have been provided ample opportunity to ask questions regarding this consent and the Services and have had my questions answered to my satisfaction.  I give my informed consent for the services to be provided through the use of telemedicine in my medical care  By participating in this telemedicine visit I agree to the above.--PT GAVE VERBAL CONSENT FOR DR NELSON TO TREAT HER VIA VIDEO VISIT ON 7/13 AT 0900, AS WELL AS IN HER MYCHART TO REVIEW

## 2019-03-28 NOTE — Telephone Encounter (Signed)
New Message  ° ° ° °Left Message to confirm appt and answer covid questions  °

## 2019-03-31 ENCOUNTER — Encounter: Payer: Self-pay | Admitting: *Deleted

## 2019-03-31 ENCOUNTER — Telehealth (INDEPENDENT_AMBULATORY_CARE_PROVIDER_SITE_OTHER): Payer: BC Managed Care – PPO | Admitting: Cardiology

## 2019-03-31 ENCOUNTER — Encounter: Payer: Self-pay | Admitting: Cardiology

## 2019-03-31 ENCOUNTER — Other Ambulatory Visit: Payer: Self-pay

## 2019-03-31 VITALS — BP 147/83 | HR 67 | Ht 61.0 in | Wt 149.5 lb

## 2019-03-31 DIAGNOSIS — I739 Peripheral vascular disease, unspecified: Secondary | ICD-10-CM | POA: Diagnosis not present

## 2019-03-31 DIAGNOSIS — E785 Hyperlipidemia, unspecified: Secondary | ICD-10-CM

## 2019-03-31 DIAGNOSIS — I10 Essential (primary) hypertension: Secondary | ICD-10-CM

## 2019-03-31 DIAGNOSIS — I2510 Atherosclerotic heart disease of native coronary artery without angina pectoris: Secondary | ICD-10-CM

## 2019-03-31 DIAGNOSIS — I214 Non-ST elevation (NSTEMI) myocardial infarction: Secondary | ICD-10-CM

## 2019-03-31 MED ORDER — OMEGA-3-ACID ETHYL ESTERS 1 G PO CAPS: 2.0000 g | ORAL_CAPSULE | Freq: Every day | ORAL | 3 refills | Status: DC

## 2019-03-31 NOTE — Patient Instructions (Signed)
Medication Instructions:   START TAKING LOVAZA 2 GRAMS BY MOUTH DAILY   If you need a refill on your cardiac medications before your next appointment, please call your pharmacy.       Follow-Up: At [facility], you and your health needs are our priority.  As part of our continuing mission to provide you with exceptional heart care, we have created designated Provider Care Teams.  These Care Teams include your primary Cardiologist (physician) and Advanced Practice Providers (APPs -  Physician Assistants and Nurse Practitioners) who all work together to provide you with the care you need, when you need it. You will need a follow up appointment in 6 months.  Please call our office 2 months in advance to schedule this appointment.  You may see Ena Dawley, MD or one of the following Advanced Practice Providers on your designated Care Team:   [place], PA-C Melina Copa, PA-C . Ermalinda Barrios, PA-C

## 2019-03-31 NOTE — Progress Notes (Signed)
Virtual Visit via Video Note   This visit type was conducted due to national recommendations for restrictions regarding the COVID-19 Pandemic (e.g. social distancing) in an effort to limit this patient's exposure and mitigate transmission in our community.  Due to her co-morbid illnesses, this patient is at least at moderate risk for complications without adequate follow up.  This format is felt to be most appropriate for this patient at this time.  All issues noted in this document were discussed and addressed.  A limited physical exam was performed with this format.  Please refer to the patient's chart for her consent to telehealth [facility].   Date:  03/31/2019   ID:  Amanda Dawson, DOB 08/09/1956, MRN [id number]  Patient Location: Home Provider Location: Office  PCP:  Malka SoJobe, [name], MD  Cardiologist:  Tobias AlexanderKatarina Bow Buntyn, MD  Electrophysiologist:  None   Evaluation Performed:  Follow-Up Visit  Chief Complaint:  followup  History of Present Illness:    Amanda Dawson is a 63 y.o. female with h/o NSTEMI in 06/2016. She was postmenopausal female who had been on Estrogen therapy, also with a h/o THN and obesity, who [facility] with a CC of SSCP. Symptoms were relieved in the ED with SL NTG. Given her symptomology, she was admitted and ruled in for NSTEMI with positive enzymes. She underwent LHC with Dr. Eldridge DaceVaranasi showing showed decreased LV ejection fraction of 35-45% on ventriculogram, she also had three-vessel disease with 99% stenosis in mid LAD that was stented, 80% stenosis of the first marginal branch that was stented, and 95% mid RCA lesion that was stented as well. Her follow up Echo showed EF of 35-40% with akinesis of the mid-apicalanteroseptal and apical myocardium, along with G1DD. She decided to enroll in the Twilight study and will continue on ASA and Brilinta for at least one year. This admission she was also started on atorvastatin 80mg , low dose  coreg and her home lisinopril was continued. Patient does take home estradiol and progesterone. It was asked that the patient  make a follow up appt with her OB/GYN to discuss reducing or stopping these given her recent findings of CAD.  03/20/2018 -patient is coming after 8 months, she is feeling great, she denies any chest pain, shortness of breath, palpitations, no lower extremity edema orthopnea proximal nocturnal dyspnea.  She has been noticing leg pain especially in her left leg predominantly at night and sometimes when she is walking.  She does not exercise as much as she wishes to, she has a gym at work but does not use it.  She is trying to walk several times a week but because of heat has not been doing it lately.  03/31/2019 - 1 year follow up, exercises every day, vitals at goal, tolerates her meds, no chest pain. She has time to prep meals and exercise and feels great.  The patient does not have symptoms concerning for COVID-19 infection (fever, chills, cough, or new shortness of breath).    Past Medical History:  Diagnosis Date   Anxiety 09/25/2016   CAD (coronary artery disease), native coronary artery 07/11/2016   Overview:  Got left heart cath and stents on 07/10/16 by Dr [facility]ep [facility] (3 vessel dz:  Mid LAD lesion 99%, first marginal 80%, and mid RCA 95%).  EF 35 to 45% by visual estimate  Overview:  Got left heart cath and stents on 07/10/16 by Dr [facility] (3  vessel dz:  Mid LAD lesion 99%, first marginal 80%, and mid RCA 95%).  EF 35 to 45% by visual estimate   Cardiomyopathy, ischemic 09/25/2016   Coronary artery disease    a. 10/17 PCI with DES to mid LAD, Ist mrg, and Mid RCA, EF 35-40%   Essential hypertension    Gastroesophageal reflux disease without esophagitis 11/17/2014   GERD (gastroesophageal reflux disease)    High cholesterol    Hormone replacement therapy (HRT) 07/03/2016   Hyperlipidemia LDL goal <100 07/11/2016   Overview:  Pt's  cardiologist is managing and monitoring   Hypertension    NSTEMI (non-ST elevated myocardial infarction) ([place]) 07/10/2016   Obesity    Seizures ([place]) 09/25/2016   Overview:  Joaquim Lai amnesia  Overview:  [employer] amnesia   Symptomatic menopausal or female climacteric states 11/17/2014   Overview:  Managed by GYN   TGA (transient global amnesia)    "several times" (07/10/2016)   Past Surgical History:  Procedure Laterality Date   CARDIAC CATHETERIZATION N/A 07/10/2016   Procedure: Left Heart Cath and Coronary Angiography;  Surgeon: Jettie Booze, MD;  Location: [place] CV [facility];  Service: Cardiovascular;  Laterality: N/A;   CARDIAC CATHETERIZATION N/A 07/10/2016   Procedure: Coronary Stent Intervention;  Surgeon: Jettie Booze, MD;  Location: [place] [facility];  Service: Cardiovascular;  Laterality: N/A;   CORONARY ANGIOPLASTY WITH STENT PLACEMENT  07/10/2016     Current Meds  Medication Sig   aspirin EC 81 MG tablet Take 1 tablet (81 mg total) by mouth daily.   atorvastatin (LIPITOR) 40 MG tablet TAKE 1 TABLET (40 MG TOTAL) DAILY BY MOUTH.   BRILINTA 90 MG TABS tablet TAKE 1 TABLET BY MOUTH TWO  TIMES DAILY   calcium citrate-vitamin D (CITRACAL+D) 315-200 MG-UNIT tablet Take 1 tablet by mouth daily.   carvedilol (COREG) 3.125 MG tablet TAKE 1 TABLET (3.125 MG TOTAL) 2 (TWO) TIMES DAILY WITH A MEAL BY MOUTH.   Cholecalciferol (VITAMIN D3) 5000 units TABS Take 1 capsule by mouth daily.   lisinopril (PRINIVIL,ZESTRIL) 20 MG tablet Take 1 tablet (20 mg total) daily by mouth.   nitroGLYCERIN (NITROSTAT) 0.4 MG SL tablet Place 1 tablet (0.4 mg total) under the tongue every 5 (five) minutes x 3 doses as needed for chest pain.   omeprazole (PRILOSEC) 20 MG capsule Take 20 mg by mouth daily.   valACYclovir (VALTREX) 1000 MG tablet Take 2 tablets by mouth 2 (two) times daily as needed.   venlafaxine XR (EFFEXOR-XR) 75 MG 24 hr capsule Take 75 mg by mouth  daily.     Allergies:   Nsaids   Social History   Tobacco Use   Smoking status: Never Smoker   Smokeless tobacco: Never Used  Substance Use Topics   Alcohol use: Yes    Alcohol/week: 12.0 standard drinks    Types: 2 Glasses of wine, 10 Shots of liquor per week   Drug use: No     Family Hx: The patient's family history includes Heart disease in her brother; Hypertension in her mother.  ROS:   Please see the history of present illness.    All other systems reviewed and are negative.   Prior CV studies:   The following studies were reviewed today:  Labs/Other Tests and Data Reviewed:    EKG:  No ECG reviewed.  Recent Labs: No results found for requested labs within last 8760 hours.   Recent Lipid Panel Lab Results  Component Value Date/Time  CHOL 83 08/15/2016 07:48 AM   TRIG 132 08/15/2016 07:48 AM   HDL 37 (L) 08/15/2016 07:48 AM   CHOLHDL 2.2 08/15/2016 07:48 AM   LDLCALC 20 08/15/2016 07:48 AM    Wt Readings from Last 3 Encounters:  03/31/19 149 lb 8 oz (67.8 kg)  03/20/18 150 lb 12.8 oz (68.4 kg)  08/06/17 150 lb (68 kg)     Objective:    Vital Signs:  BP (!) 147/83    Pulse 67    Ht 5\' 1"  (1.549 m)    Wt 149 lb 8 oz (67.8 kg)    BMI 28.25 kg/m    VITAL SIGNS:  reviewed  ASSESSMENT & PLAN:    1. CAD: s/p NSTEMI in 06/2016 secondary to 3V obstructive disease with LHC showing 99% stenosis in mid LAD that was stented, 80% stenosis of the first marginal branch that was stented, and 95% mid RCA lesion that was stented as well. EF 35-40% by echo. NYHA I. Repeat echo in January 2018 showed that her LVEF has recovered to 55-60%.  We will continue dual antiplatelet therapy with aspirin and low-dose Brilinta she has no bleeding or bruising and normal hemoglobin.  We will continue atorvastatin 40 mg daily, Coreg 3.125 mg p.o. twice daily, lisinopril 20 mg daily and nitroglycerin as needed, so far she did not need to use it.  2. Ischemic CMP - Systolic HF: EF  post NSTEMI was 35-40%-> improved to 55-60% in January 2018. NYHA I, she is strongly encouraged to start regular exercise.   3. HTN: well controlled on current regimen. Continue Coreg and lisinopril.   4. HLD: LDL HDL at goal, triglycerides 227, will add Lovaza 2 g daily  5. H/o Hormone Replacement Therapy: estradiol and progesterone recently discontinued by her gynecologist.   6.  Claudications -normal bilateral arterial duplex.  Follow-up in 6 months   COVID-19 Education: The signs and symptoms of COVID-19 were discussed with the patient and how to seek care for testing (follow up with PCP or arrange E-visit).  The importance of social distancing was discussed today.  Time:   Today, I have spent 25 minutes with the patient with telehealth technology discussing the above problems.     Medication Adjustments/Labs and Tests Ordered: Current medicines are reviewed at length with the patient today.  Concerns regarding medicines are outlined above.   Tests Ordered: No orders of the defined types were placed in this encounter.   Medication Changes: No orders of the defined types were placed in this encounter.   Follow Up:  Virtual Visit or In Person in 6 month(s)  Signed, Tobias AlexanderKatarina Samarion Ehle, MD  03/31/2019 9:36 AM    [facility]

## 2019-04-01 ENCOUNTER — Telehealth: Payer: Self-pay

## 2019-04-01 NOTE — Telephone Encounter (Signed)
I have started an Omega-3-Acid Ethyl Esters PA through covermymeds. Key: Tod [place]

## 2019-04-07 NOTE — Telephone Encounter (Signed)
Letter received from [employer] stating that they have approved the pts Omege-3- Acid PA.   Approval good until 03/31/2020  PA Case# [id number]

## 2019-05-18 ENCOUNTER — Other Ambulatory Visit: Payer: Self-pay | Admitting: Cardiology

## 2019-08-02 ENCOUNTER — Other Ambulatory Visit: Payer: Self-pay | Admitting: Cardiology

## 2019-08-02 DIAGNOSIS — I2510 Atherosclerotic heart disease of native coronary artery without angina pectoris: Secondary | ICD-10-CM

## 2019-09-02 ENCOUNTER — Other Ambulatory Visit: Payer: Self-pay | Admitting: Cardiology

## 2019-09-02 DIAGNOSIS — I2510 Atherosclerotic heart disease of native coronary artery without angina pectoris: Secondary | ICD-10-CM

## 2019-12-10 ENCOUNTER — Other Ambulatory Visit: Payer: Self-pay

## 2019-12-10 ENCOUNTER — Encounter: Payer: Self-pay | Admitting: Cardiology

## 2019-12-10 ENCOUNTER — Ambulatory Visit: Payer: BC Managed Care – PPO | Admitting: Cardiology

## 2019-12-10 VITALS — BP 126/74 | HR 78 | Ht 61.5 in | Wt 157.0 lb

## 2019-12-10 DIAGNOSIS — I10 Essential (primary) hypertension: Secondary | ICD-10-CM | POA: Diagnosis not present

## 2019-12-10 DIAGNOSIS — I2510 Atherosclerotic heart disease of native coronary artery without angina pectoris: Secondary | ICD-10-CM | POA: Diagnosis not present

## 2019-12-10 DIAGNOSIS — E785 Hyperlipidemia, unspecified: Secondary | ICD-10-CM | POA: Diagnosis not present

## 2019-12-10 NOTE — Progress Notes (Signed)
Cardiology Office Note:    Date:  12/10/2019   ID:  Amanda Dawson, DOB 25-Mar-1956, MRN [id number]  PCP:  Lilian Coma., MD  Cardiologist:  Ena Dawley, MD  Electrophysiologist:  None   Referring MD: Lilian Coma., MD   Reason for visit: 1 year follow up  History of Present Illness:    Amanda Dawson is a 64 y.o. female with a hx of with h/o NSTEMI in 06/2016, LV ejection fraction of 35-45% on ventriculogram, she also had three-vessel disease with 99% stenosis in mid LAD that was stented, 80% stenosis of the first marginal branch that was stented, and 95% mid RCA lesion that was stented as well. Repeat echo in January 2018 showed that her LVEF has recovered to 55-60%. She decided to enroll in the Twilight study and will continue on ASA and Brilinta for at least one year.  Hormone replacement therapy was discontinued.    03/31/2019 - 1 year follow up, exercises every day, vitals at goal, tolerates her meds, no chest pain. She has time to prep meals and exercise and feels great.  12/10/2019 -the patient is coming after a year, she has been doing great, she continues to work but plans to retire in June, she walks a lot at work, however has not had time to walk outside her exercise.  She denies any chest pain or shortness of breath, no lower extremity edema, she has been tolerating her medications well, she has no bleeding.  Past Medical History:  Diagnosis Date  . Anxiety 09/25/2016  . CAD (coronary artery disease), native coronary artery 07/11/2016   Overview:  Got left heart cath and stents on 07/10/16 by Dr Larae Grooms at [facility] (3 vessel dz:  Mid LAD lesion 99%, first marginal 80%, and mid RCA 95%).  EF 35 to 45% by visual estimate  Overview:  Got left heart cath and stents on 07/10/16 by Dr Larae Grooms at [facility] (3 vessel dz:  Mid LAD lesion 99%, first marginal 80%, and mid RCA 95%).  EF 35 to 45% by visual estimate  . Cardiomyopathy, ischemic 09/25/2016  . Coronary artery  disease    a. 10/17 PCI with DES to mid LAD, Ist mrg, and Mid RCA, EF 35-40%  . Essential hypertension   . Gastroesophageal reflux disease without esophagitis 11/17/2014  . GERD (gastroesophageal reflux disease)   . High cholesterol   . Hormone replacement therapy (HRT) 07/03/2016  . Hyperlipidemia LDL goal <100 07/11/2016   Overview:  Pt's cardiologist is managing and monitoring  . Hypertension   . NSTEMI (non-ST elevated myocardial infarction) ([place]) 07/10/2016  . Obesity   . Seizures ([place]) 09/25/2016   Overview:  Joaquim Lai amnesia  Overview:  [employer] amnesia  . Symptomatic menopausal or female climacteric states 11/17/2014   Overview:  Managed by GYN  . TGA (transient global amnesia)    "several times" (07/10/2016)    Past Surgical History:  Procedure Laterality Date  . CARDIAC CATHETERIZATION N/A 07/10/2016   Procedure: Left Heart Cath and Coronary Angiography;  Surgeon: Jettie Booze, MD;  Location: [place] CV [facility];  Service: Cardiovascular;  Laterality: N/A;  . CARDIAC CATHETERIZATION N/A 07/10/2016   Procedure: Coronary Stent Intervention;  Surgeon: Jettie Booze, MD;  Location: [place] CV LAB;  Service: Cardiovascular;  Laterality: N/A;  . CORONARY ANGIOPLASTY WITH STENT PLACEMENT  07/10/2016   Current Medications: Current Meds  Medication Sig  . aspirin EC 81 MG tablet Take 1 tablet (81 mg  total) by mouth daily.  Marland Kitchen atorvastatin (LIPITOR) 40 MG tablet TAKE 1 TABLET (40 MG TOTAL) DAILY BY MOUTH.  . BRILINTA 90 MG TABS tablet TAKE 1 TABLET BY MOUTH  TWICE DAILY  . calcium citrate-vitamin D (CITRACAL+D) 315-200 MG-UNIT tablet Take 1 tablet by mouth daily.  . carvedilol (COREG) 3.125 MG tablet TAKE 1 TABLET (3.125 MG TOTAL) 2 (TWO) TIMES DAILY WITH A MEAL BY MOUTH.  . Cholecalciferol (VITAMIN D3) 5000 units TABS Take 1 capsule by mouth daily.  Marland Kitchen lisinopril (PRINIVIL,ZESTRIL) 20 MG tablet Take 1 tablet (20 mg total) daily by mouth.  . nitroGLYCERIN  (NITROSTAT) 0.4 MG SL tablet Place 1 tablet (0.4 mg total) under the tongue every 5 (five) minutes x 3 doses as needed for chest pain.  Marland Kitchen omega-3 acid ethyl esters (LOVAZA) 1 g capsule Take 2 capsules (2 g total) by mouth daily.  Marland Kitchen omeprazole (PRILOSEC) 20 MG capsule Take 20 mg by mouth daily.  . valACYclovir (VALTREX) 1000 MG tablet Take 2 tablets by mouth 2 (two) times daily as needed.  . venlafaxine XR (EFFEXOR-XR) 75 MG 24 hr capsule Take 75 mg by mouth daily.    Allergies:   Nsaids   Social History   Socioeconomic History  . Marital status: Married    Spouse name: Not on file  . Number of children: Not on file  . Years of education: Not on file  . Highest education level: Not on file  Occupational History  . Not on file  Tobacco Use  . Smoking status: Never Smoker  . Smokeless tobacco: Never Used  Substance and Sexual Activity  . Alcohol use: Yes    Alcohol/week: 12.0 standard drinks    Types: 2 Glasses of wine, 10 Shots of liquor per week  . Drug use: No  . Sexual activity: Yes  Other Topics Concern  . Not on file  Social History Narrative  . Not on file   Social Determinants of Health   Financial Resource Strain:   . Difficulty of Paying Living Expenses:   Food Insecurity:   . Worried About Programme researcher, broadcasting/film/video in the Last Year:   . Barista in the Last Year:   Transportation Needs:   . Freight forwarder (Medical):   Marland Kitchen Lack of Transportation (Non-Medical):   Physical Activity:   . Days of Exercise per Week:   . Minutes of Exercise per Session:   Stress:   . Feeling of Stress :   Social Connections:   . Frequency of Communication with Friends and Family:   . Frequency of Social Gatherings with Friends and Family:   . Attends Religious Services:   . Active Member of Clubs or Organizations:   . Attends Banker Meetings:   Marland Kitchen Marital Status:      Family History: The patient's family history includes Heart disease in her brother;  Hypertension in her mother.  ROS:   Please see the history of present illness.    All other systems reviewed and are negative.  EKGs/Labs/Other Studies Reviewed:    The following studies were reviewed today:  EKG:  EKG is ordered today.  The ekg ordered today demonstrates normal sinus rhythm, normal EKG, unchanged from prior.  This was personally reviewed.  Recent Labs: No results found for requested labs within last 8760 hours.  Recent Lipid Panel    Component Value Date/Time   CHOL 83 08/15/2016 0748   TRIG 132 08/15/2016 0748   HDL  37 (L) 08/15/2016 0748   CHOLHDL 2.2 08/15/2016 0748   VLDL 26 08/15/2016 0748   LDLCALC 20 08/15/2016 0748    Physical Exam:    VS:  BP 126/74   Pulse 78   Ht 5' 1.5" (1.562 m)   Wt 157 lb (71.2 kg)   SpO2 98%   BMI 29.18 kg/m     Wt Readings from Last 3 Encounters:  12/10/19 157 lb (71.2 kg)  03/31/19 149 lb 8 oz (67.8 kg)  03/20/18 150 lb 12.8 oz (68.4 kg)     GEN:  Well nourished, well developed in no acute distress HEENT: Normal NECK: No JVD; No carotid bruits LYMPHATICS: No lymphadenopathy CARDIAC: RRR, no murmurs, rubs, gallops RESPIRATORY:  Clear to auscultation without rales, wheezing or rhonchi  ABDOMEN: Soft, non-tender, non-distended MUSCULOSKELETAL:  No edema; No deformity  SKIN: Warm and dry NEUROLOGIC:  Alert and oriented x 3 PSYCHIATRIC:  Normal affect    ASSESSMENT:    1. Hyperlipidemia LDL goal <100   2. Coronary artery disease involving native coronary artery of native heart, angina presence unspecified   3. Essential hypertension    PLAN:    1. CAD: s/p NSTEMI in 06/2016 secondary to 3V obstructive disease with LHC showing 99% stenosis in mid LAD that was stented, 80% stenosis of the first marginal branch that was stented, and 95% mid RCA lesion that was stented as well. EF 35-40% by echo. NYHA I. Repeat echo in January 2018 showed that her LVEF has recovered to 55-60%.  We will continue dual  antiplatelet therapy with aspirin and low-dose Brilinta, she has no bleeding or minimal bruising, we will obtain blood work from her PCP.  She has no side effects with atorvastatin, she would like to switch to a different type of fish oil as Lovaza has large pills hard to swallow, she is advised to take 2 to 4 g of omega-3 acids.   2. Ischemic CMP - Systolic HF: EF post NSTEMI was 35-40%-> improved to 55-60% in January 2018. NYHA I, she is strongly encouraged to start regular exercise.   3. HTN: Well-controlled, no orthostatic hypotension.  4. HLD: Statins tolerated well, we will obtain her lipids from her primary care physician.  Medication Adjustments/Labs and Tests Ordered: Current medicines are reviewed at length with the patient today.  Concerns regarding medicines are outlined above.  Orders Placed This Encounter  Procedures  . EKG 12-Lead   No orders of the defined types were placed in this encounter.   Patient Instructions  Medication Instructions:   Your physician recommends that you continue on your current medications as directed. Please refer to the Current Medication list given to you today.  *If you need a refill on your cardiac medications before your next appointment, please call your pharmacy*   Lab Work:  YOU CAN HAVE YOUR LABS FAXED TO OUR OFFICE AT [phone] ATTENTION TO DR. Delton See AND IVY  If you have labs (blood work) drawn today and your tests are completely normal, you will receive your results only by: Marland Kitchen MyChart Message (if you have MyChart) OR . A paper copy in the mail If you have any lab test that is abnormal or we need to change your treatment, we will call you to review the results.    Follow-Up: At [facility], you and your health needs are our priority.  As part of our continuing mission to provide you with exceptional heart care, we have created designated Provider Care Teams.  These Care Teams include your primary Cardiologist (physician)  and Advanced Practice Providers (APPs -  Physician Assistants and Nurse Practitioners) who all work together to provide you with the care you need, when you need it.  We recommend signing up for the patient portal called "MyChart".  Sign up information is provided on this After Visit Summary.  MyChart is used to connect with patients for Virtual Visits (Telemedicine).  Patients are able to view lab/test results, encounter notes, upcoming appointments, etc.  Non-urgent messages can be sent to your provider as well.   To learn more about what you can do with MyChart, go to [email or web address].    Your next appointment:   12 month(s)  The format for your next appointment:   In Person  Provider:   Tobias Alexander, MD       Signed, Tobias Alexander, MD  12/10/2019 8:53 AM    [facility]

## 2019-12-10 NOTE — Patient Instructions (Signed)
Medication Instructions:   Your physician recommends that you continue on your current medications as directed. Please refer to the Current Medication list given to you today.  *If you need a refill on your cardiac medications before your next appointment, please call your pharmacy*   Lab Work:  YOU CAN HAVE YOUR LABS FAXED TO OUR OFFICE AT [phone] ATTENTION TO DR. Delton See AND Shatonya Passon  If you have labs (blood work) drawn today and your tests are completely normal, you will receive your results only by: Marland Kitchen MyChart Message (if you have MyChart) OR . A paper copy in the mail If you have any lab test that is abnormal or we need to change your treatment, we will call you to review the results.    Follow-Up: At [facility], you and your health needs are our priority.  As part of our continuing mission to provide you with exceptional heart care, we have created designated Provider Care Teams.  These Care Teams include your primary Cardiologist (physician) and Advanced Practice Providers (APPs -  Physician Assistants and Nurse Practitioners) who all work together to provide you with the care you need, when you need it.  We recommend signing up for the patient portal called "MyChart".  Sign up information is provided on this After Visit Summary.  MyChart is used to connect with patients for Virtual Visits (Telemedicine).  Patients are able to view lab/test results, encounter notes, upcoming appointments, etc.  Non-urgent messages can be sent to your provider as well.   To learn more about what you can do with MyChart, go to [email or web address].    Your next appointment:   12 month(s)  The format for your next appointment:   In Person  Provider:   Tobias Alexander, MD

## 2020-03-29 ENCOUNTER — Other Ambulatory Visit: Payer: Self-pay | Admitting: Cardiology

## 2020-03-29 DIAGNOSIS — I2510 Atherosclerotic heart disease of native coronary artery without angina pectoris: Secondary | ICD-10-CM

## 2020-04-06 ENCOUNTER — Other Ambulatory Visit: Payer: Self-pay | Admitting: Cardiology

## 2020-10-31 ENCOUNTER — Other Ambulatory Visit: Payer: Self-pay | Admitting: Cardiology

## 2020-10-31 DIAGNOSIS — I2510 Atherosclerotic heart disease of native coronary artery without angina pectoris: Secondary | ICD-10-CM

## 2020-12-06 NOTE — Progress Notes (Signed)
Cardiology Office Note    Date:  12/08/2020   ID:  Amanda Dawson, DOB 08-16-1956, MRN [id number]   PCP:  Barrington Ellison   [facility]  Cardiologist:  Tobias Alexander, MD  Advanced Practice Provider:  No care team member to display Electrophysiologist:  None   [id number]}   No chief complaint on file.   History of Present Illness:  Amanda Dawson is a 65 y.o. female with history of NSTEMI 06/2016 treated with stenting to the mid LAD, stent to the OM1 and stent to the mid RCA.  LVEF 35 to 45% on ventriculogram, repeat echo 09/2016 EF 55 to 60%.  She was in the twilight study on aspirin and Brilinta and hormone replacement was discontinued.  Patient was last seen by Dr. Delton See 12/10/2019 and doing well.  Patient comes in for yearly f/u. Retired last June from [place] and is doing well. Staying busy. Walks 15 min and stationary bike 15 min daily. BP up today. She was rushing to get here. Usually 139/80's at home. She just took her meds. Asking if she needs to stay on Brilinta cost $120 for 90 days. Denies chest pain, dyspnea, dizziness, edema, presyncope. Labs reviewed from care everywhere LDL 39, Trig 251 otherwise labs normal.   Past Medical History:  Diagnosis Date  . Anxiety 09/25/2016  . CAD (coronary artery disease), native coronary artery 07/11/2016   Overview:  Got left heart cath and stents on 07/10/16 by Dr Lance Muss at [facility] (3 vessel dz:  Mid LAD lesion 99%, first marginal 80%, and mid RCA 95%).  EF 35 to 45% by visual estimate  Overview:  Got left heart cath and stents on 07/10/16 by Dr Lance Muss at [facility] (3 vessel dz:  Mid LAD lesion 99%, first marginal 80%, and mid RCA 95%).  EF 35 to 45% by visual estimate  . Cardiomyopathy, ischemic 09/25/2016  . Coronary artery disease    a. 10/17 PCI with DES to mid LAD, Ist mrg, and Mid RCA, EF 35-40%  . Essential hypertension   . Gastroesophageal reflux disease without esophagitis 11/17/2014  .  GERD (gastroesophageal reflux disease)   . High cholesterol   . Hormone replacement therapy (HRT) 07/03/2016  . Hyperlipidemia LDL goal <100 07/11/2016   Overview:  Pt's cardiologist is managing and monitoring  . Hypertension   . NSTEMI (non-ST elevated myocardial infarction) ([facility]) 07/10/2016  . Obesity   . Seizures ([facility]) 09/25/2016   Overview:  Amanda Dawson amnesia  Overview:  [employer] amnesia  . Symptomatic menopausal or female climacteric states 11/17/2014   Overview:  Managed by GYN  . TGA (transient global amnesia)    "several times" (07/10/2016)    Past Surgical History:  Procedure Laterality Date  . CARDIAC CATHETERIZATION N/A 07/10/2016   Procedure: Left Heart Cath and Coronary Angiography;  Surgeon: Corky Crafts, MD;  Location: [facility] INVASIVE [facility] LAB;  Service: Cardiovascular;  Laterality: N/A;  . CARDIAC CATHETERIZATION N/A 07/10/2016   Procedure: Coronary Stent Intervention;  Surgeon: Corky Crafts, MD;  Location[facility] INVASIVE CV LAB;  Service: Cardiovascular;  Laterality: N/A;  . CORONARY ANGIOPLASTY WITH STENT PLACEMENT  07/10/2016    Current Medications: Current Meds  Medication Sig  . aspirin EC 81 MG tablet Take 1 tablet (81 mg total) by mouth daily.  Marland Kitchen atorvastatin (LIPITOR) 40 MG tablet TAKE 1 TABLET (40 MG TOTAL) DAILY BY MOUTH.  . BRILINTA 90 MG TABS tablet TAKE 1 TABLET BY MOUTH  TWICE DAILY  . calcium citrate-vitamin D (CITRACAL+D) 315-200 MG-UNIT tablet Take 1 tablet by mouth daily.  . carvedilol (COREG) 3.125 MG tablet Take 1 tablet (3.125 mg total) by mouth 2 (two) times daily with a meal. Please keep upcoming appointment in March 2022 for future refills. Thank you  . lisinopril (PRINIVIL,ZESTRIL) 20 MG tablet Take 1 tablet (20 mg total) daily by mouth.  . nitroGLYCERIN (NITROSTAT) 0.4 MG SL tablet Place 1 tablet (0.4 mg total) under the tongue every 5 (five) minutes x 3 doses as needed for chest pain.  Marland Kitchen omega-3 acid ethyl esters (LOVAZA) 1 g capsule  Take 2 capsules (2 g total) by mouth daily.  Marland Kitchen omeprazole (PRILOSEC) 20 MG capsule Take 20 mg by mouth daily.  . valACYclovir (VALTREX) 1000 MG tablet Take 2 tablets by mouth 2 (two) times daily as needed.  . venlafaxine XR (EFFEXOR-XR) 75 MG 24 hr capsule Take 75 mg by mouth daily.     Allergies:   Patient has no active allergies.   Social History   Socioeconomic History  . Marital status: Married    Spouse name: Not on file  . Number of children: Not on file  . Years of education: Not on file  . Highest education level: Not on file  Occupational History  . Not on file  Tobacco Use  . Smoking status: Never Smoker  . Smokeless tobacco: Never Used  Vaping Use  . Vaping Use: Never used  Substance and Sexual Activity  . Alcohol use: Yes    Alcohol/week: 12.0 standard drinks    Types: 2 Glasses of wine, 10 Shots of liquor per week  . Drug use: No  . Sexual activity: Yes  Other Topics Concern  . Not on file  Social History Narrative  . Not on file   Social Determinants of Health   Financial Resource Strain: Not on file  Food Insecurity: Not on file  Transportation Needs: Not on file  Physical Activity: Not on file  Stress: Not on file  Social Connections: Not on file     Family History:  The patient's family history includes Heart disease in her brother; Hypertension in her mother.   ROS:   Please see the history of present illness.    ROS All other systems reviewed and are negative.   PHYSICAL EXAM:   VS:  BP (!) 160/92   Pulse 70   Ht 5' 1.5" (1.562 m)   Wt 157 lb 6.4 oz (71.4 kg)   SpO2 98%   BMI 29.26 kg/m   Physical Exam  GEN: Well nourished, well developed, in no acute distress  Neck: no JVD, carotid bruits, or masses Cardiac:RRR; no murmurs, rubs, or gallops  Respiratory:  clear to auscultation bilaterally, normal work of breathing GI: soft, nontender, nondistended, + BS Ext: without cyanosis, clubbing, or edema, Good distal pulses  bilaterally Neuro:  Alert and Oriented x 3 Psych: euthymic mood, full affect  Wt Readings from Last 3 Encounters:  12/08/20 157 lb 6.4 oz (71.4 kg)  12/10/19 157 lb (71.2 kg)  03/31/19 149 lb 8 oz (67.8 kg)      Studies/Labs Reviewed:   EKG:  EKG is  ordered today.  The ekg ordered today demonstrates NSR with NSST changes no acute change  Recent Labs: No results found for requested labs within last 8760 hours.   Lipid Panel    Component Value Date/Time   CHOL 83 08/15/2016 0748   TRIG 132 08/15/2016 0748  HDL 37 (L) 08/15/2016 0748   CHOLHDL 2.2 08/15/2016 0748   VLDL 26 08/15/2016 0748   LDLCALC 20 08/15/2016 0748    Additional studies/ records that were reviewed today include:  2D echo 10/02/2016 Study Conclusions   - Left ventricle: The cavity size was normal. Systolic function was    normal. The estimated ejection fraction was in the range of 55%    to 60%. Wall motion was normal; there were no regional wall    motion abnormalities. Left ventricular diastolic function    parameters were normal.  - Atrial septum: No defect or patent foramen ovale was identified.  - Impressions: GLS abnormal but does not appear to have tracked    well.   Impressions:   - GLS abnormal but does not appear to have tracked well.   Cardiac catheterization 07/10/2016  The left ventricular ejection fraction is 35-45% by visual estimate.  There is mild to moderate left ventricular systolic dysfunction.  LV end diastolic pressure is normal.  There is no aortic valve stenosis.  Mid LAD lesion, 99 %stenosed. A STENT PROMUS PREM MR 2.5X38 drug eluting stent was successfully placed, postdilated to 2.8 mm.  Post intervention, there is a 0% residual stenosis.  1st Mrg lesion, 80 %stenosed. A STENT PROMUS PREM MR 2.75X12 drug eluting stent was successfully placed, postdilated to 3.0 mm.  Post intervention, there is a 0% residual stenosis.  Mid RCA lesion, 95 %stenosed. A STENT PROMUS  PREM MR 2.5X32 drug eluting stent was successfully placed, postdilated to > 3 mm in diameter.  Post intervention, there is a 0% residual stenosis.   Continue dual antiplatelet therapy along with aggressive secondary prevention.  I stressed the importance of dual antiplatelet therapy.  She will need a repeat echo in 6- 8 weeks to see that her LVEF improved.  Add ACE-I and continue beta blocker for LV dysfunction.        Risk Assessment/Calculations:         ASSESSMENT:    1. Coronary artery disease involving native coronary artery of native heart without angina pectoris   2. Ischemic cardiomyopathy   3. Essential hypertension   4. Hyperlipidemia, unspecified hyperlipidemia type      PLAN:  In order of problems listed above:  CAD status post NSTEMI 2017 treated with DES to the mid LAD, DES to OM1, DES to the mid RCA on Brilinta and aspirin. Patient asking if she needs lifelong Brilinta. Will verify with Dr. Delton See and Dr. Eldridge Dace. 150 min exercise weekly.  Ischemic cardiomyopathy ejection fraction 35 to 40% improved to 55 to 60% on echo 09/2016-no CHF symptoms  Hypertension BP up today but just took meds and rushing to get here. I've asked her to keep track of BP at home and send [place] mychart message with readings. 2 gm sodium diet  Hyperlipidemia LDL 39, trig 251 07/2020-reduce sugars including sweet tea in diet  Shared Decision Making/Informed Consent        Medication Adjustments/Labs and Tests Ordered: Current medicines are reviewed at length with the patient today.  Concerns regarding medicines are outlined above.  Medication changes, Labs and Tests ordered today are listed in the Patient Instructions below. There are no Patient Instructions on file for this visit.   Elson Clan, PA-C  12/08/2020 8:43 AM    [facility] [address] [place], [place], [place]  [address] Phone: [phone]; Fax: [phone]

## 2020-12-08 ENCOUNTER — Encounter: Payer: Self-pay | Admitting: Physician Assistant

## 2020-12-08 ENCOUNTER — Other Ambulatory Visit: Payer: Self-pay

## 2020-12-08 ENCOUNTER — Ambulatory Visit: Payer: Medicare Other | Admitting: Physician Assistant

## 2020-12-08 VITALS — BP 160/92 | HR 70 | Ht 61.5 in | Wt 157.4 lb

## 2020-12-08 DIAGNOSIS — E785 Hyperlipidemia, unspecified: Secondary | ICD-10-CM | POA: Diagnosis not present

## 2020-12-08 DIAGNOSIS — I10 Essential (primary) hypertension: Secondary | ICD-10-CM

## 2020-12-08 DIAGNOSIS — I2510 Atherosclerotic heart disease of native coronary artery without angina pectoris: Secondary | ICD-10-CM | POA: Diagnosis not present

## 2020-12-08 DIAGNOSIS — I255 Ischemic cardiomyopathy: Secondary | ICD-10-CM

## 2020-12-08 NOTE — Patient Instructions (Signed)
Medication Instructions:  Your physician recommends that you continue on your current medications as directed. Please refer to the Current Medication list given to you today.  *If you need a refill on your cardiac medications before your next appointment, please call your pharmacy*   Lab Work: None If you have labs (blood work) drawn today and your tests are completely normal, you will receive your results only by: Marland Kitchen MyChart Message (if you have MyChart) OR . A paper copy in the mail If you have any lab test that is abnormal or we need to change your treatment, we will call you to review the results.   Follow-Up: At [facility], you and your health needs are our priority.  As part of our continuing mission to provide you with exceptional heart care, we have created designated Provider Care Teams.  These Care Teams include your primary Cardiologist (physician) and Advanced Practice Providers (APPs -  Physician Assistants and Nurse Practitioners) who all work together to provide you with the care you need, when you need it.  Your next appointment:   1 year(s)  The format for your next appointment:   In Person  Provider:   Laurance Flatten, MD   Other Instructions Check your blood pressure for 2 weeks and send [place] the readings.   Two Gram Sodium Diet 2000 mg  What is Sodium? Sodium is a mineral found naturally in many foods. The most significant source of sodium in the diet is table salt, which is about 40% sodium.  Processed, convenience, and preserved foods also contain a large amount of sodium.  The body needs only 500 mg of sodium daily to function,  A normal diet provides more than enough sodium even if you do not use salt.  Why Limit Sodium? A build up of sodium in the body can cause thirst, increased blood pressure, shortness of breath, and water retention.  Decreasing sodium in the diet can reduce edema and risk of heart attack or stroke associated with high blood pressure.   Keep in mind that there are many other factors involved in these health problems.  Heredity, obesity, lack of exercise, cigarette smoking, stress and what you eat all play a role.  General Guidelines:  Do not add salt at the table or in cooking.  One teaspoon of salt contains over 2 grams of sodium.  Read food labels  Avoid processed and convenience foods  Ask your dietitian before eating any foods not dicussed in the menu planning guidelines  Consult your physician if you wish to use a salt substitute or a sodium containing medication such as antacids.  Limit milk and milk products to 16 oz (2 cups) per day.  Shopping Hints:  READ LABELS!! "Dietetic" does not necessarily mean low sodium.  Salt and other sodium ingredients are often added to foods during processing.   Menu Planning Guidelines Food Group Choose More Often Avoid  Beverages (see also the milk group All fruit juices, low-sodium, salt-free vegetables juices, low-sodium carbonated beverages Regular vegetable or tomato juices, commercially softened water used for drinking or cooking  Breads and Cereals Enriched white, wheat, rye and pumpernickel bread, hard rolls and dinner rolls; muffins, cornbread and waffles; most dry cereals, cooked cereal without added salt; unsalted crackers and breadsticks; low sodium or homemade bread crumbs Bread, rolls and crackers with salted tops; quick breads; instant hot cereals; pancakes; commercial bread stuffing; self-rising flower and biscuit mixes; regular bread crumbs or cracker crumbs  Desserts and Sweets Desserts  and sweets mad with mild should be within allowance Instant pudding mixes and cake mixes  Fats Butter or margarine; vegetable oils; unsalted salad dressings, regular salad dressings limited to 1 Tbs; light, sour and heavy cream Regular salad dressings containing bacon fat, bacon bits, and salt pork; snack dips made with instant soup mixes or processed cheese; salted nuts  Fruits  Most fresh, frozen and canned fruits Fruits processed with salt or sodium-containing ingredient (some dried fruits are processed with sodium sulfites        Vegetables Fresh, frozen vegetables and low- sodium canned vegetables Regular canned vegetables, sauerkraut, pickled vegetables, and others prepared in brine; frozen vegetables in sauces; vegetables seasoned with ham, bacon or salt pork  Condiments, Sauces, Miscellaneous  Salt substitute with physician's approval; pepper, herbs, spices; vinegar, lemon or lime juice; hot pepper sauce; garlic powder, onion powder, low sodium soy sauce (1 Tbs.); low sodium condiments (ketchup, chili sauce, mustard) in limited amounts (1 tsp.) fresh ground horseradish; unsalted tortilla chips, pretzels, potato chips, popcorn, salsa (1/4 cup) Any seasoning made with salt including garlic salt, celery salt, onion salt, and seasoned salt; sea salt, rock salt, kosher salt; meat tenderizers; monosodium glutamate; mustard, regular soy sauce, barbecue, sauce, chili sauce, teriyaki sauce, steak sauce, Worcestershire sauce, and most flavored vinegars; canned gravy and mixes; regular condiments; salted snack foods, olives, picles, relish, horseradish sauce, catsup   Food preparation: Try these seasonings Meats:    Pork Sage, onion Serve with applesauce  Chicken Poultry seasoning, thyme, parsley Serve with cranberry sauce  Lamb Curry powder, rosemary, garlic, thyme Serve with mint sauce or jelly  Veal Marjoram, basil Serve with current jelly, cranberry sauce  Beef Pepper, bay leaf Serve with dry mustard, unsalted chive butter  Fish [place] leaf, dill Serve with unsalted lemon butter, unsalted parsley butter  Vegetables:    Asparagus Lemon juice   Broccoli Lemon juice   Carrots Mustard dressing parsley, mint, nutmeg, glazed with unsalted butter and sugar   Green beans Marjoram, lemon juice, nutmeg,dill seed   Tomatoes Basil, marjoram, onion   Spice /blend for [employer]"  4 tsp ground thyme 1 tsp ground sage 3 tsp ground rosemary 4 tsp ground marjoram   Test your knowledge 1. A product that says "Salt Free" may still contain sodium. True or False 2. Garlic Powder and Hot Pepper Sauce an be used as alternative seasonings.True or False 3. Processed foods have more sodium than fresh foods.  True or False 4. Canned Vegetables have less sodium than froze True or False  WAYS TO DECREASE YOUR SODIUM INTAKE 1. Avoid the use of added salt in cooking and at the table.  Table salt (and other prepared seasonings which contain salt) is probably one of the greatest sources of sodium in the diet.  Unsalted foods can gain flavor from the sweet, sour, and butter taste sensations of herbs and spices.  Instead of using salt for seasoning, try the following seasonings with the foods listed.  Remember: how you use them to enhance natural food flavors is limited only by your creativity... Allspice-Meat, fish, eggs, fruit, peas, red and yellow vegetables Almond Extract-Fruit baked goods Anise Seed-Sweet breads, fruit, carrots, beets, cottage cheese, cookies (tastes like licorice) Basil-Meat, fish, eggs, vegetables, rice, vegetables salads, soups, sauces Bay Leaf-Meat, fish, stews, poultry Burnet-Salad, vegetables (cucumber-like flavor) Caraway Seed-Bread, cookies, cottage cheese, meat, vegetables, cheese, rice Cardamon-Baked goods, fruit, soups Celery Powder or seed-Salads, salad dressings, sauces, meatloaf, soup, bread.Do not use  celery salt Chervil-Meats,  salads, fish, eggs, vegetables, cottage cheese (parsley-like flavor) Chili Power-Meatloaf, chicken cheese, corn, eggplant, egg dishes Chives-Salads cottage cheese, egg dishes, soups, vegetables, sauces Cilantro-Salsa, casseroles Cinnamon-Baked goods, fruit, pork, lamb, chicken, carrots Cloves-Fruit, baked goods, fish, pot roast, green beans, beets, carrots Coriander-Pastry, cookies, meat, salads, cheese (lemon-orange  flavor) Cumin-Meatloaf, fish,cheese, eggs, cabbage,fruit pie (caraway flavor) [employer], fruit, eggs, fish, poultry, cottage cheese, vegetables Dill Seed-Meat, cottage cheese, poultry, vegetables, fish, salads, bread Fennel Seed-Bread, cookies, apples, pork, eggs, fish, beets, cabbage, cheese, Licorice-like flavor Garlic-(buds or powder) Salads, meat, poultry, fish, bread, butter, vegetables, potatoes.Do not  use garlic salt Ginger-Fruit, vegetables, baked goods, meat, fish, poultry Horseradish Root-Meet, vegetables, butter Lemon Juice or Extract-Vegetables, fruit, tea, baked goods, fish salads Mace-Baked goods fruit, vegetables, fish, poultry (taste like nutmeg) Maple Extract-Syrups Marjoram-Meat, chicken, fish, vegetables, breads, green salads (taste like Sage) Mint-Tea, lamb, sherbet, vegetables, desserts, carrots, cabbage Mustard, Dry or Seed-Cheese, eggs, meats, vegetables, poultry Nutmeg-Baked goods, fruit, chicken, eggs, vegetables, desserts Onion Powder-Meat, fish, poultry, vegetables, cheese, eggs, bread, rice salads (Do not use   Onion salt) Orange Extract-Desserts, baked goods Oregano-Pasta, eggs, cheese, onions, pork, lamb, fish, chicken, vegetables, green salads Paprika-Meat, fish, poultry, eggs, cheese, vegetables Parsley Flakes-Butter, vegetables, meat fish, poultry, eggs, bread, salads (certain forms may   Contain sodium Pepper-Meat fish, poultry, vegetables, eggs Peppermint Extract-Desserts, baked goods Poppy Seed-Eggs, bread, cheese, fruit dressings, baked goods, noodles, vegetables, cottage  [employer], poultry, meat, fish, cauliflower, turnips,eggs bread Saffron-Rice, bread, veal, chicken, fish, eggs Sage-Meat, fish, poultry, onions, eggplant, tomateos, pork, stews Savory-Eggs, salads, poultry, meat, rice, vegetables, soups, pork Tarragon-Meat, poultry, fish, eggs, butter, vegetables (licorice-like  flavor)  Thyme-Meat, poultry, fish, eggs, vegetables, (clover-like flavor), sauces, soups Tumeric-Salads, butter, eggs, fish, rice, vegetables (saffron-like flavor) Vanilla Extract-Baked goods, candy Vinegar-Salads, vegetables, meat marinades Walnut Extract-baked goods, candy  2. Choose your Foods Wisely   The following is a list of foods to avoid which are high in sodium:  Meats-Avoid all smoked, canned, salt cured, dried and kosher meat and fish as well as Anchovies   [employer] meats:Bologna, Liverwurst, Pastrami Canned meat or fish  Marinated herring Caviar    Pepperoni Corned Beef   Pizza Dried chipped beef  Salami Frozen breaded fish or meat Salt pork Frankfurters or hot dogs  Sardines Gefilte fish   Sausage Ham (boiled ham, Proscuitto Smoked butt    spiced ham)   Spam      TV Dinners Vegetables Canned vegetables (Regular) Relish Canned mushrooms  Sauerkraut Olives    Tomato juice Pickles  Bakery and Dessert Products Canned puddings  Cream pies Cheesecake   Decorated cakes Cookies  Beverages/Juices Tomato juice, regular  Gatorade   V-8 vegetable juice, regular  Breads and Cereals Biscuit mixes   Salted potato chips, corn chips, pretzels Bread stuffing mixes  Salted crackers and rolls Pancake and waffle mixes Self-rising flour  Seasonings Accent    Meat sauces Barbecue sauce  Meat tenderizer Catsup    Monosodium glutamate (MSG) Celery salt   Onion salt Chili sauce   Prepared mustard Garlic salt   Salt, seasoned salt, sea salt Gravy mixes   Soy sauce Horseradish   Steak sauce Ketchup   Tartar sauce Lite salt    Teriyaki sauce Marinade mixes   Worcestershire sauce  Others Baking powder   Cocoa and cocoa mixes Baking soda   Commercial casserole mixes Candy-caramels, chocolate  Dehydrated soups    Bars, fudge,nougats  Instant rice and pasta  mixes Canned broth or soup  Maraschino cherries Cheese, aged and processed cheese and cheese  spreads  Learning Assessment Quiz  Indicated T (for True) or F (for False) for each of the following statements:  1. _____ Fresh fruits and vegetables and unprocessed grains are generally low in sodium 2. _____ Water may contain a considerable amount of sodium, depending on the source 3. _____ You can always tell if a food is high in sodium by tasting it 4. _____ Certain laxatives my be high in sodium and should be avoided unless prescribed   by a physician or pharmacist 5. _____ Salt substitutes may be used freely by anyone on a sodium restricted diet 6. _____ Sodium is present in table salt, food additives and as a natural component of   most foods 7. _____ Table salt is approximately 90% sodium 8. _____ Limiting sodium intake may help prevent excess fluid accumulation in the body 9. _____ On a sodium-restricted diet, seasonings such as bouillon soy sauce, and    cooking wine should be used in place of table salt 10. _____ On an ingredient list, a product which lists monosodium glutamate as the first   ingredient is an appropriate food to include on a low sodium diet  Circle the best answer(s) to the following statements (Hint: there may be more than one correct answer)  11. On a low-sodium diet, some acceptable snack items are:    A. Olives  F. Bean dip   K. Grapefruit juice    B. Salted Pretzels G. Commercial Popcorn   L. Canned peaches    C. Carrot Sticks  H. Bouillon   M. Unsalted nuts   D. [place] fries  I. Peanut butter crackers N. Salami   E. Sweet pickles J. Tomato Juice   O. Pizza  12.  Seasonings that may be used freely on a reduced - sodium diet include   A. Lemon wedges F.Monosodium glutamate K. Celery seed    B.Soysauce   G. Pepper   L. Mustard powder   C. Sea salt  H. Cooking wine  M. Onion flakes   D. Vinegar  E. Prepared horseradish N. Salsa   E. Sage   J. Worcestershire sauce  O. Chutney

## 2020-12-08 NOTE — Progress Notes (Signed)
Given that she has had three vessel  stenting, I would stop Brilinta and keep her on clopidogrel 75 mg daily indefinitely. This should be much less expensive.

## 2020-12-09 NOTE — Progress Notes (Signed)
Can you let this patient know that Dr. Eldridge Dace reviewed her cath and records. He recommends she stop Brilinta but start Plavix 75 mg once daily in addition to ASA 81 mg daily. This should be much less expensive than Brilinta. She can finish her current Rx of Brilinta before starting Plavix. Thanks

## 2020-12-10 ENCOUNTER — Telehealth: Payer: Self-pay

## 2020-12-10 MED ORDER — CLOPIDOGREL BISULFATE 75 MG PO TABS: 75.0000 mg | ORAL_TABLET | Freq: Every day | ORAL | 3 refills | Status: DC

## 2020-12-10 NOTE — Telephone Encounter (Signed)
Per Dr Eldridge Dace recommendations. I advised pt to d/c Brilinta when she finishes what she has then switch to Plavix 75mg  daily and ASA 81mg  daily.

## 2020-12-10 NOTE — Addendum Note (Signed)
Addended by: Cleda Mccreedy on: 12/10/2020 04:05 PM   Modules accepted: Orders

## 2021-01-17 ENCOUNTER — Other Ambulatory Visit: Payer: Self-pay | Admitting: *Deleted

## 2021-01-17 DIAGNOSIS — I2510 Atherosclerotic heart disease of native coronary artery without angina pectoris: Secondary | ICD-10-CM

## 2021-01-17 MED ORDER — ATORVASTATIN CALCIUM 40 MG PO TABS: 40.0000 mg | ORAL_TABLET | Freq: Every day | ORAL | 11 refills | Status: DC

## 2021-05-02 ENCOUNTER — Other Ambulatory Visit: Payer: Self-pay | Admitting: *Deleted

## 2021-05-02 DIAGNOSIS — I2510 Atherosclerotic heart disease of native coronary artery without angina pectoris: Secondary | ICD-10-CM

## 2021-05-02 MED ORDER — CARVEDILOL 3.125 MG PO TABS: 3.1250 mg | ORAL_TABLET | Freq: Two times a day (BID) | ORAL | 1 refills | Status: DC

## 2021-09-16 ENCOUNTER — Other Ambulatory Visit: Payer: Self-pay

## 2021-09-16 DIAGNOSIS — I2510 Atherosclerotic heart disease of native coronary artery without angina pectoris: Secondary | ICD-10-CM

## 2021-09-16 MED ORDER — CARVEDILOL 3.125 MG PO TABS: 3.1250 mg | ORAL_TABLET | Freq: Two times a day (BID) | ORAL | 1 refills | Status: DC

## 2021-10-06 ENCOUNTER — Other Ambulatory Visit: Payer: Self-pay

## 2021-10-06 DIAGNOSIS — I2510 Atherosclerotic heart disease of native coronary artery without angina pectoris: Secondary | ICD-10-CM

## 2021-10-06 MED ORDER — CARVEDILOL 3.125 MG PO TABS: 3.1250 mg | ORAL_TABLET | Freq: Two times a day (BID) | ORAL | 0 refills | Status: DC

## 2021-12-06 NOTE — Progress Notes (Signed)
? ?Cardiology Office Note   ? ?Date:  12/13/2021  ? ?ID:  Amanda Dawson, DOB 1956-03-27, MRN [id number] ? ? ?PCP:  Curt Jews, PA-C ?  ?[place]  ?Cardiologist:  Freada Bergeron, MD   ?Advanced Practice Provider:  No care team member to display ?Electrophysiologist:  None  ? ?[id number]  ? ?Chief Complaint  ?Patient presents with  ? Follow-up  ? ? ?History of Present Illness:  ?Amanda Dawson is a 66 y.o. female with history of NSTEMI 06/2016 treated with stenting to the mid LAD, stent to the OM1 and stent to the mid RCA.  LVEF 35 to 45% on ventriculogram, repeat echo 09/2016 EF 55 to 60%.  She was in the twilight study on aspirin and Brilinta and hormone replacement was discontinued.  ? ?I saw the patient 12/08/20 and doing well. Trouble affording brilinta and Dr. Irish Lack said she could switch to Plavix. ? ?Patient comes in for yearly f/u. Denies chest pain, dyspnea, palpitations, dizziness or presyncope. No regular exercise but going to start walking since the weather is improving. 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 ? ?Past Medical History:  ?Diagnosis Date  ? Anxiety 09/25/2016  ? CAD (coronary artery disease), native coronary artery 07/11/2016  ? Overview:  Got left heart cath and stents on 07/10/16 by Dr Larae Grooms at [facility] (3 vessel dz:  Mid LAD lesion 99%, first marginal 80%, and mid RCA 95%).  EF 35 to 45% by visual estimate  Overview:  Got left heart cath and stents on 07/10/16 by Dr Larae Grooms at [facility] (3 vessel dz:  Mid LAD lesion 99%, first marginal 80%, and mid RCA 95%).  EF 35 to 45% by visual estimate  ? Cardiomyopathy, ischemic 09/25/2016  ? Coronary artery disease   ? a. 10/17 PCI with DES to mid LAD, Ist mrg, and Mid RCA, EF 35-40%  ? Essential hypertension   ? Gastroesophageal reflux disease without esophagitis 11/17/2014  ? GERD (gastroesophageal reflux disease)   ? High cholesterol   ? Hormone replacement therapy  (HRT) 07/03/2016  ? Hyperlipidemia LDL goal <100 07/11/2016  ? Overview:  Pt's cardiologist is managing and monitoring  ? Hypertension   ? NSTEMI (non-ST elevated myocardial infarction) ([place]) 07/10/2016  ? Obesity   ? Seizures ([place]) 09/25/2016  ? Overview:  Transglobal amnesia  Overview:  Transglobal amnesia  ? Symptomatic menopausal or female climacteric states 11/17/2014  ? Overview:  Managed by GYN  ? 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 ? ?Past Surgical History:  ?Procedure Laterality Date  ? CARDIAC CATHETERIZATION N/A 07/10/2016  ? Procedure: Left Heart Cath and Coronary Angiography;  Surgeon: Jettie Booze, MD;  Location: [place] [facility];  Service: Cardiovascular;  Laterality: N/A;  ? CARDIAC CATHETERIZATION N/A 07/10/2016  ? Procedure: Coronary Stent Intervention;  Surgeon: Jettie Booze, MD;  Location: [place] [facility];  Service: Cardiovascular;  Laterality: N/A;  ? CORONARY ANGIOPLASTY WITH STENT PLACEMENT  07/10/2016  ? ? ?Current Medications: ?Current Meds  ?Medication Sig  ? aspirin EC 81 MG tablet Take 1 tablet (81 mg total) by mouth daily.  ? atorvastatin (LIPITOR) 40 MG tablet Take 1 tablet (40 mg total) by mouth daily.  ? calcium citrate-vitamin D (CITRACAL+D) 315-200 MG-UNIT tablet Take 1 tablet by mouth daily.  ? carvedilol (COREG) 3.125 MG tablet Take 1 tablet (3.125 mg total) by mouth 2 (two) times daily with  a meal. Please keep upcoming appt. In March with Dr. Johney Frame in order to receive future refills. Thank You.  ? clopidogrel (PLAVIX) 75 MG tablet Take 1 tablet (75 mg total) by mouth daily.  ? lisinopril (PRINIVIL,ZESTRIL) 20 MG tablet Take 1 tablet (20 mg total) daily by mouth.  ? nitroGLYCERIN (NITROSTAT) 0.4 MG SL tablet Place 1 tablet (0.4 mg total) under the tongue every 5 (five) minutes x 3 doses as needed for chest pain.  ? omeprazole (PRILOSEC) 20 MG capsule Take 20 mg by mouth daily.  ? valACYclovir (VALTREX) 1000 MG tablet Take 2 tablets  by mouth 2 (two) times daily as needed.  ? venlafaxine XR (EFFEXOR-XR) 75 MG 24 hr capsule Take 75 mg by mouth daily.  ?  ? ?Allergies:   Patient has no known allergies.  ? ?Social History  ? ?Socioeconomic History  ? Marital status: Married  ?  Spouse name: Not on file  ? Number of children: Not on file  ? Years of education: Not on file  ? Highest education level: Not on file  ?Occupational History  ? Not on file  ?Tobacco Use  ? Smoking status: Never  ? Smokeless tobacco: Never  ?Vaping Use  ? Vaping Use: Never used  ?Substance and Sexual Activity  ? Alcohol use: Yes  ?  Alcohol/week: 12.0 standard drinks  ?  Types: 2 Glasses of wine, 10 Shots of liquor per week  ? Drug use: No  ? Sexual activity: Yes  ?Other Topics Concern  ? Not on file  ?Social History Narrative  ? Not on file  ? ?Social Determinants of Health  ? ?Financial Resource Strain: Not on file  ?Food Insecurity: Not on file  ?Transportation Needs: Not on file  ?Physical Activity: Not on file  ?Stress: Not on file  ?Social Connections: Not on file  ?  ? ?Family History:  The patient's  family history includes Heart disease in her brother; Hypertension in her mother.  ? ?ROS:   ?Please see the history of present illness.    ?ROS All other systems reviewed and are negative. ? ? ?PHYSICAL EXAM:   ?VS:  BP 134/72   Pulse 74   Ht 5' 1.5" (1.562 m)   Wt 157 lb (71.2 kg)   SpO2 98%   BMI 29.18 kg/m?   ?Physical Exam  ?GEN: Well nourished, well developed, in no acute distress  ?Neck: no JVD, carotid bruits, or masses ?Cardiac:RRR; no murmurs, rubs, or gallops  ?Respiratory:  clear to auscultation bilaterally, normal work of breathing ?GI: soft, nontender, nondistended, + BS ?Ext: without cyanosis, clubbing, or edema, Good distal pulses bilaterally ?Neuro:  Alert and Oriented x 3 ?Psych: euthymic mood, full affect ? ?Wt Readings from Last 3 Encounters:  ?12/13/21 157 lb (71.2 kg)  ?12/08/20 157 lb 6.4 oz (71.4 kg)  ?12/10/19 157 lb (71.2 kg)  ?   ? ? ?Studies/Labs Reviewed:  ? ?EKG:  EKG is  ordered today.  The ekg ordered today demonstrates  NSR ? ?Recent Labs: ?No results found for requested labs within last 8760 hours.  ? ?Lipid Panel ?   ?Component Value Date/Time  ? CHOL 83 08/15/2016 0748  ? TRIG 132 08/15/2016 0748  ? HDL 37 (L) 08/15/2016 0748  ? CHOLHDL 2.2 08/15/2016 0748  ? VLDL 26 08/15/2016 0748  ? [place] 20 08/15/2016 0748  ? ? ?Additional studies/ records that were reviewed today include:  ?2D echo 10/02/2016 ?Study Conclusions  ? ?- Left ventricle:  The cavity size was normal. Systolic function was  ?  normal. The estimated ejection fraction was in the range of 55%  ?  to 60%. Wall motion was normal; there were no regional wall  ?  motion abnormalities. Left ventricular diastolic function  ?  parameters were normal.  ?- Atrial septum: No defect or patent foramen ovale was identified.  ?- Impressions: GLS abnormal but does not appear to have tracked  ?  well.  ? ?Impressions:  ? ?- GLS abnormal but does not appear to have tracked well.  ?  ?Cardiac catheterization 07/10/2016 ?The left ventricular ejection fraction is 35-45% by visual estimate. ?There is mild to moderate left ventricular systolic dysfunction. ?LV end diastolic pressure is normal. ?There is no aortic valve stenosis. ?Mid LAD lesion, 99 %stenosed. A STENT PROMUS PREM MR 2.5X38 drug eluting stent was successfully placed, postdilated to 2.8 mm. ?Post intervention, there is a 0% residual stenosis. ?1st Mrg lesion, 80 %stenosed. A STENT PROMUS PREM MR [id number] drug eluting stent was successfully placed, postdilated to 3.0 mm. ?Post intervention, there is a 0% residual stenosis. ?Mid RCA lesion, 95 %stenosed. A STENT PROMUS PREM MR 2.5X32 drug eluting stent was successfully placed, postdilated to > 3 mm in diameter. ?Post intervention, there is a 0% residual stenosis. ?  ?Continue dual antiplatelet therapy along with aggressive secondary prevention.  I stressed the importance of dual  antiplatelet therapy.  She will need a repeat echo in 6- 8 weeks to see that her LVEF improved.  Add ACE-I and continue beta blocker for LV dysfunction.  ?  ?  ?  ? ? ?Risk Assessment/Calculations:   ?  ? ? ? ? ?ASSE

## 2021-12-13 ENCOUNTER — Ambulatory Visit: Payer: Medicare Other | Admitting: Physician Assistant

## 2021-12-13 ENCOUNTER — Other Ambulatory Visit: Payer: Self-pay

## 2021-12-13 ENCOUNTER — Encounter: Payer: Self-pay | Admitting: Physician Assistant

## 2021-12-13 VITALS — BP 134/72 | HR 74 | Ht 61.5 in | Wt 157.0 lb

## 2021-12-13 DIAGNOSIS — E785 Hyperlipidemia, unspecified: Secondary | ICD-10-CM

## 2021-12-13 DIAGNOSIS — I255 Ischemic cardiomyopathy: Secondary | ICD-10-CM | POA: Diagnosis not present

## 2021-12-13 DIAGNOSIS — I10 Essential (primary) hypertension: Secondary | ICD-10-CM

## 2021-12-13 DIAGNOSIS — I2510 Atherosclerotic heart disease of native coronary artery without angina pectoris: Secondary | ICD-10-CM | POA: Diagnosis not present

## 2021-12-13 NOTE — Patient Instructions (Signed)
Medication Instructions:  Your physician recommends that you continue on your current medications as directed. Please refer to the Current Medication list given to you today.  *If you need a refill on your cardiac medications before your next appointment, please call your pharmacy*   Lab Work: None If you have labs (blood work) drawn today and your tests are completely normal, you will receive your results only by: MyChart Message (if you have MyChart) OR A paper copy in the mail If you have any lab test that is abnormal or we need to change your treatment, we will call you to review the results.  Follow-Up: At [facility], you and your health needs are our priority.  As part of our continuing mission to provide you with exceptional heart care, we have created designated Provider Care Teams.  These Care Teams include your primary Cardiologist (physician) and Advanced Practice Providers (APPs -  Physician Assistants and Nurse Practitioners) who all work together to provide you with the care you need, when you need it.  Your next appointment:   1 year(s)  The format for your next appointment:   In Person  Provider:   Heather Pemberton, MD   Other Instructions Your provider recommends that you maintain 150 minutes per week of moderate aerobic activity.     

## 2021-12-23 ENCOUNTER — Other Ambulatory Visit: Payer: Self-pay

## 2021-12-23 DIAGNOSIS — I2510 Atherosclerotic heart disease of native coronary artery without angina pectoris: Secondary | ICD-10-CM

## 2021-12-23 MED ORDER — ATORVASTATIN CALCIUM 40 MG PO TABS: 40.0000 mg | ORAL_TABLET | Freq: Every day | ORAL | 11 refills | Status: DC

## 2022-01-27 ENCOUNTER — Other Ambulatory Visit: Payer: Self-pay

## 2022-01-27 MED ORDER — NITROGLYCERIN 0.4 MG SL SUBL: 0.4000 mg | SUBLINGUAL_TABLET | SUBLINGUAL | 11 refills | Status: DC | PRN

## 2022-01-27 NOTE — Telephone Encounter (Signed)
Pt's medication was sent to pt's pharmacy as requested. Confirmation received.  °

## 2022-02-07 ENCOUNTER — Other Ambulatory Visit: Payer: Self-pay | Admitting: Physician Assistant

## 2022-06-12 ENCOUNTER — Other Ambulatory Visit: Payer: Self-pay | Admitting: *Deleted

## 2022-06-12 DIAGNOSIS — I2510 Atherosclerotic heart disease of native coronary artery without angina pectoris: Secondary | ICD-10-CM

## 2022-06-12 MED ORDER — CARVEDILOL 3.125 MG PO TABS: 3.1250 mg | ORAL_TABLET | Freq: Two times a day (BID) | ORAL | 1 refills | Status: DC

## 2022-06-29 DIAGNOSIS — H5213 Myopia, bilateral: Secondary | ICD-10-CM | POA: Diagnosis not present

## 2022-09-22 DIAGNOSIS — I129 Hypertensive chronic kidney disease with stage 1 through stage 4 chronic kidney disease, or unspecified chronic kidney disease: Secondary | ICD-10-CM | POA: Diagnosis not present

## 2022-09-22 DIAGNOSIS — Z0000 Encounter for general adult medical examination without abnormal findings: Secondary | ICD-10-CM | POA: Diagnosis not present

## 2022-09-22 DIAGNOSIS — K219 Gastro-esophageal reflux disease without esophagitis: Secondary | ICD-10-CM | POA: Diagnosis not present

## 2022-09-22 DIAGNOSIS — E785 Hyperlipidemia, unspecified: Secondary | ICD-10-CM | POA: Diagnosis not present

## 2022-09-22 DIAGNOSIS — N1831 Chronic kidney disease, stage 3a: Secondary | ICD-10-CM | POA: Diagnosis not present

## 2022-09-22 DIAGNOSIS — I10 Essential (primary) hypertension: Secondary | ICD-10-CM | POA: Diagnosis not present

## 2022-10-10 DIAGNOSIS — Z1231 Encounter for screening mammogram for malignant neoplasm of breast: Secondary | ICD-10-CM | POA: Diagnosis not present

## 2022-12-05 NOTE — Progress Notes (Signed)
Cardiology Office Note:    Date:  12/19/2022   ID:  [name], DOB August 28, 1956, MRN [id number]  PCP:  Joella Prince  [place] [facility] Cardiologist:  Freada Bergeron, MD     Referring MD: Curt Jews, PA-C   Chief Complaint:  Follow-up     History of Present Illness:   Amanda Dawson is a 67 y.o. female with   with history of NSTEMI 06/2016 treated with stenting to the mid LAD, stent to the OM1 and stent to the mid RCA.  LVEF 35 to 45% on ventriculogram, repeat echo 09/2016 EF 55 to 60%.  She was in the twilight study on aspirin and Brilinta and hormone replacement was discontinued.    I saw the patient 12/08/20 and doing well. Trouble affording brilinta and Dr. Irish Lack said she could switch to Plavix.     I saw the patient 11/2021 and she was doing well.   Patient comes in for f/u. Denies chest pain, dyspnea, palpitations, edema. Doing great. She started walking about a mile a couple days a week. Labs reviewed in care everywhere. Trig up and drinking sweet tea.  BP up today. Didn't take her lisinopril yet.     Past Medical History:  Diagnosis Date   Anxiety 09/25/2016   CAD (coronary artery disease), native coronary artery 07/11/2016   Overview:  Got left heart cath and stents on 07/10/16 by Dr Larae Grooms at [facility] (3 vessel dz:  Mid LAD lesion 99%, first marginal 80%, and mid RCA 95%).  EF 35 to 45% by visual estimate  Overview:  Got left heart cath and stents on 07/10/16 by Dr Larae Grooms at [facility] (3 vessel dz:  Mid LAD lesion 99%, first marginal 80%, and mid RCA 95%).  EF 35 to 45% by visual estimate   Cardiomyopathy, ischemic 09/25/2016   Coronary artery disease    a. 10/17 PCI with DES to mid LAD, Ist mrg, and Mid RCA, EF 35-40%   Essential hypertension    Gastroesophageal reflux disease without esophagitis 11/17/2014   GERD (gastroesophageal reflux disease)    High cholesterol    Hormone replacement therapy (HRT) 07/03/2016    Hyperlipidemia LDL goal <100 07/11/2016   Overview:  Pt's cardiologist is managing and monitoring   Hypertension    NSTEMI (non-ST elevated myocardial infarction) 07/10/2016   Obesity    Seizures 09/25/2016   Overview:  Joaquim Lai amnesia  Overview:  [employer] amnesia   Symptomatic menopausal or female climacteric states 11/17/2014   Overview:  Managed by GYN   TGA (transient global amnesia)    "several times" (07/10/2016)   Current Medications: Current Meds  Medication Sig   aspirin EC 81 MG tablet Take 1 tablet (81 mg total) by mouth daily.   atorvastatin (LIPITOR) 40 MG tablet Take 1 tablet (40 mg total) by mouth daily.   calcium citrate-vitamin D (CITRACAL+D) 315-200 MG-UNIT tablet Take 1 tablet by mouth daily.   carvedilol (COREG) 3.125 MG tablet Take 1 tablet (3.125 mg total) by mouth 2 (two) times daily with a meal.   lisinopril (PRINIVIL,ZESTRIL) 20 MG tablet Take 1 tablet (20 mg total) daily by mouth.   nitroGLYCERIN (NITROSTAT) 0.4 MG SL tablet Place 1 tablet (0.4 mg total) under the tongue every 5 (five) minutes x 3 doses as needed for chest pain.   omega-3 acid ethyl esters (LOVAZA) 1 g capsule Take 2 capsules (2 g total) by mouth daily.   omeprazole (PRILOSEC) 20 MG  capsule Take 20 mg by mouth daily.   valACYclovir (VALTREX) 1000 MG tablet Take 2 tablets by mouth 2 (two) times daily as needed.   venlafaxine XR (EFFEXOR-XR) 75 MG 24 hr capsule Take 75 mg by mouth daily.   [DISCONTINUED] clopidogrel (PLAVIX) 75 MG tablet Take 1 tablet by mouth once daily    Allergies:   Patient has no known allergies.   Social History   Tobacco Use   Smoking status: Never   Smokeless tobacco: Never  Vaping Use   Vaping Use: Never used  Substance Use Topics   Alcohol use: Yes    Alcohol/week: 12.0 standard drinks of alcohol    Types: 2 Glasses of wine, 10 Shots of liquor per week   Drug use: No    Family Hx: The patient's family history includes Heart disease in her brother;  Hypertension in her mother.  ROS     Physical Exam:    VS:  BP (!) 160/90   Pulse 64   Ht 5' 1.5" (1.562 m)   Wt 154 lb 6.4 oz (70 kg)   SpO2 98%   BMI 28.70 kg/m     Wt Readings from Last 3 Encounters:  12/19/22 154 lb 6.4 oz (70 kg)  12/13/21 157 lb (71.2 kg)  12/08/20 157 lb 6.4 oz (71.4 kg)    Physical Exam  GEN: Well nourished, well developed, in no acute distress  Neck: no JVD, carotid bruits, or masses Cardiac:RRR; no murmurs, rubs, or gallops  Respiratory:  clear to auscultation bilaterally, normal work of breathing GI: soft, nontender, nondistended, + BS Ext: without cyanosis, clubbing, or edema, Good distal pulses bilaterally Neuro:  Alert and Oriented x 3,  Psych: euthymic mood, full affect        EKGs/Labs/Other Test Reviewed:    EKG:  EKG is   ordered today.  The ekg ordered today demonstrates NSR, normal EKG  Recent Labs: No results found for requested labs within last 365 days.   Recent Lipid Panel No results for input(s): "CHOL", "TRIG", "HDL", "VLDL", "LDLCALC", "LDLDIRECT" in the last 8760 hours.   Prior CV Studies:     2D echo 10/02/2016 Study Conclusions   - Left ventricle: The cavity size was normal. Systolic function was    normal. The estimated ejection fraction was in the range of 55%    to 60%. Wall motion was normal; there were no regional wall    motion abnormalities. Left ventricular diastolic function    parameters were normal.  - Atrial septum: No defect or patent foramen ovale was identified.  - Impressions: GLS abnormal but does not appear to have tracked    well.   Impressions:   - GLS abnormal but does not appear to have tracked well.    Cardiac catheterization 07/10/2016 The left ventricular ejection fraction is 35-45% by visual estimate. There is mild to moderate left ventricular systolic dysfunction. LV end diastolic pressure is normal. There is no aortic valve stenosis. Mid LAD lesion, 99 %stenosed. A STENT  PROMUS PREM MR 2.5X38 drug eluting stent was successfully placed, postdilated to 2.8 mm. Post intervention, there is a 0% residual stenosis. 1st Mrg lesion, 80 %stenosed. A STENT PROMUS PREM MR [id number] drug eluting stent was successfully placed, postdilated to 3.0 mm. Post intervention, there is a 0% residual stenosis. Mid RCA lesion, 95 %stenosed. A STENT PROMUS PREM MR 2.5X32 drug eluting stent was successfully placed, postdilated to > 3 mm in diameter. Post intervention, there is a 0%  residual stenosis.   Continue dual antiplatelet therapy along with aggressive secondary prevention.  I stressed the importance of dual antiplatelet therapy.  She will need a repeat echo in 6- 8 weeks to see that her LVEF improved.  Add ACE-I and continue beta blocker for LV dysfunction.         Risk Assessment/Calculations/Metrics:         HYPERTENSION CONTROL Vitals:   12/19/22 0949 12/19/22 1015  BP: (!) 170/90 (!) 160/90    The patient's blood pressure is elevated above target today.  In order to address the patient's elevated BP: Blood pressure will be monitored at home to determine if medication changes need to be made.       ASSESSMENT & PLAN:   No problem-specific Assessment & Plan notes found for this encounter.   CAD status post NSTEMI 2017 treated with DES to the mid LAD, DES to OM1, DES to the mid RCA on Plavix and aspirin. Labs reviewed from [facility] 09/2022 Crt 1.14, Hgb 14.2, trig 226, LDL 46. No angina. Recommend 150 min exercise weekly     Ischemic cardiomyopathy ejection fraction 35 to 40% improved to 55 to 60% on echo 09/2016-no CHF symptoms   Hypertension BP elevated today but hasn't taken lisinopril and coreg yet. She'll keep track of BP at home and send [place] a message with readings. Can increase either med.    Hyperlipidemia lab 1/2024on lipitor 40 mg daily LDL 46, trig 226- drinking sweet tea-have asked her to cut back.              Dispo:  No follow-ups on file.    Medication Adjustments/Labs and Tests Ordered: Current medicines are reviewed at length with the patient today.  Concerns regarding medicines are outlined above.  Tests Ordered: Orders Placed This Encounter  Procedures   EKG 12-Lead   Medication Changes: Meds ordered this encounter  Medications   clopidogrel (PLAVIX) 75 MG tablet    Sig: Take 1 tablet (75 mg total) by mouth daily.    Dispense:  90 tablet    Refill:  3   Signed, Ermalinda Barrios, PA-C  12/19/2022 10:16 AM    [address], [place], [place]  [address] Phone: [phone]; Fax: [phone]

## 2022-12-07 ENCOUNTER — Other Ambulatory Visit: Payer: Self-pay | Admitting: *Deleted

## 2022-12-07 DIAGNOSIS — I2510 Atherosclerotic heart disease of native coronary artery without angina pectoris: Secondary | ICD-10-CM

## 2022-12-07 MED ORDER — CARVEDILOL 3.125 MG PO TABS: 3.1250 mg | ORAL_TABLET | Freq: Two times a day (BID) | ORAL | 3 refills | Status: DC

## 2022-12-07 MED ORDER — ATORVASTATIN CALCIUM 40 MG PO TABS: 40.0000 mg | ORAL_TABLET | Freq: Every day | ORAL | 3 refills | Status: DC

## 2022-12-19 ENCOUNTER — Ambulatory Visit: Payer: Medicare Other | Attending: Physician Assistant | Admitting: Physician Assistant

## 2022-12-19 ENCOUNTER — Encounter: Payer: Self-pay | Admitting: Physician Assistant

## 2022-12-19 VITALS — BP 160/90 | HR 64 | Ht 61.5 in | Wt 154.4 lb

## 2022-12-19 DIAGNOSIS — I2510 Atherosclerotic heart disease of native coronary artery without angina pectoris: Secondary | ICD-10-CM | POA: Diagnosis not present

## 2022-12-19 DIAGNOSIS — I255 Ischemic cardiomyopathy: Secondary | ICD-10-CM | POA: Diagnosis not present

## 2022-12-19 DIAGNOSIS — E785 Hyperlipidemia, unspecified: Secondary | ICD-10-CM | POA: Diagnosis not present

## 2022-12-19 DIAGNOSIS — I10 Essential (primary) hypertension: Secondary | ICD-10-CM

## 2022-12-19 MED ORDER — CLOPIDOGREL BISULFATE 75 MG PO TABS: 75.0000 mg | ORAL_TABLET | Freq: Every day | ORAL | 3 refills | Status: DC

## 2022-12-19 NOTE — Patient Instructions (Signed)
Medication Instructions:  Your physician recommends that you continue on your current medications as directed. Please refer to the Current Medication list given to you today.  *If you need a refill on your cardiac medications before your next appointment, please call your pharmacy*   Lab Work: NONE If you have labs (blood work) drawn today and your tests are completely normal, you will receive your results only by: [place] (if you have MyChart) OR A paper copy in the mail If you have any lab test that is abnormal or we need to change your treatment, we will call you to review the results.   Testing/Procedures: NONE   Follow-Up: At [facility], you and your health needs are our priority.  As part of our continuing mission to provide you with exceptional heart care, we have created designated Provider Care Teams.  These Care Teams include your primary Cardiologist (physician) and Advanced Practice Providers (APPs -  Physician Assistants and Nurse Practitioners) who all work together to provide you with the care you need, when you need it.  We recommend signing up for the patient portal called "MyChart".  Sign up information is provided on this After Visit Summary.  MyChart is used to connect with patients for Virtual Visits (Telemedicine).  Patients are able to view lab/test results, encounter notes, upcoming appointments, etc.  Non-urgent messages can be sent to your provider as well.   To learn more about what you can do with MyChart, go to [email or web address].    Your next appointment:   1 year(s)  Provider:   Freada Bergeron, MD     Other Instructions Please check your blood pressure 1 time per day 2 hours after each meal for 2 weeks and send readings to Ermalinda Barrios, PA-C in [place] 150 MINS OF EXERCISE WEEKLY. Exercising to Stay Healthy To become healthy and stay healthy, it is recommended that you do  moderate-intensity and vigorous-intensity exercise. You can tell that you are exercising at a moderate intensity if your heart starts beating faster and you start breathing faster but can still hold a conversation. You can tell that you are exercising at a vigorous intensity if you are breathing much harder and faster and cannot hold a conversation while exercising. How can exercise benefit me? Exercising regularly is important. It has many health benefits, such as: Improving overall fitness, flexibility, and endurance. Increasing bone density. Helping with weight control. Decreasing body fat. Increasing muscle strength and endurance. Reducing stress and tension, anxiety, depression, or anger. Improving overall health. What guidelines should I follow while exercising? Before you start a new exercise program, talk with your health care provider. Do not exercise so much that you hurt yourself, feel dizzy, or get very short of breath. Wear comfortable clothes and wear shoes with good support. Drink plenty of water while you exercise to prevent dehydration or heat stroke. Work out until your breathing and your heartbeat get faster (moderate intensity). How often should I exercise? Choose an activity that you enjoy, and set realistic goals. Your health care provider can help you make an activity plan that is individually designed and works best for you. Exercise regularly as told by your health care provider. This may include: Doing strength training two times a week, such as: Lifting weights. Using resistance bands. Push-ups. Sit-ups. Yoga. Doing a certain intensity of exercise for a given amount of time. Choose from these options: A total of 150 minutes of  moderate-intensity exercise every week. A total of 75 minutes of vigorous-intensity exercise every week. A mix of moderate-intensity and vigorous-intensity exercise every week. Children, pregnant women, people who have not exercised  regularly, people who are overweight, and older adults may need to talk with a health care provider about what activities are safe to perform. If you have a medical condition, be sure to talk with your health care provider before you start a new exercise program. What are some exercise ideas? Moderate-intensity exercise ideas include: Walking 1 mile (1.6 km) in about 15 minutes. Biking. Hiking. Golfing. Dancing. Water aerobics. Vigorous-intensity exercise ideas include: Walking 4.5 miles (7.2 km) or more in about 1 hour. Jogging or running 5 miles (8 km) in about 1 hour. Biking 10 miles (16.1 km) or more in about 1 hour. Lap swimming. Roller-skating or in-line skating. Cross-country skiing. Vigorous competitive sports, such as football, basketball, and soccer. Jumping rope. Aerobic dancing. What are some everyday activities that can help me get exercise? Yard work, such as: Psychologist, educational. Raking and bagging leaves. Washing your car. Pushing a stroller. Shoveling snow. Gardening. Washing windows or floors. How can I be more active in my day-to-day activities? Use stairs instead of an elevator. Take a walk during your lunch break. If you drive, park your car farther away from your work or school. If you take public transportation, get off one stop early and walk the rest of the way. Stand up or walk around during all of your indoor phone calls. Get up, stretch, and walk around every 30 minutes throughout the day. Enjoy exercise with a friend. Support to continue exercising will help you keep a regular routine of activity. Where to find more information You can find more information about exercising to stay healthy from: U.S. Department of Health and Human Services: Bonded[employer].at Centers for Disease Control and Prevention (CDC): http://www.wolf.info/ Summary Exercising regularly is important. It will improve your overall fitness, flexibility, and endurance. Regular exercise will  also improve your overall health. It can help you control your weight, reduce stress, and improve your bone density. Do not exercise so much that you hurt yourself, feel dizzy, or get very short of breath. Before you start a new exercise program, talk with your health care provider. This information is not intended to replace advice given to you by your health care provider. Make sure you discuss any questions you have with your health care provider. Document Revised: 12/31/2020 Document Reviewed: 12/31/2020 Elsevier Patient Education  [place].

## 2023-02-14 DIAGNOSIS — K080 Exfoliation of teeth due to systemic causes: Secondary | ICD-10-CM | POA: Diagnosis not present

## 2023-04-02 DIAGNOSIS — Z124 Encounter for screening for malignant neoplasm of cervix: Secondary | ICD-10-CM | POA: Diagnosis not present

## 2023-07-02 DIAGNOSIS — H524 Presbyopia: Secondary | ICD-10-CM | POA: Diagnosis not present

## 2023-08-20 DIAGNOSIS — K080 Exfoliation of teeth due to systemic causes: Secondary | ICD-10-CM | POA: Diagnosis not present

## 2023-09-03 ENCOUNTER — Telehealth: Payer: Self-pay | Admitting: Cardiology

## 2023-09-03 MED ORDER — NITROGLYCERIN 0.4 MG SL SUBL: 0.4000 mg | SUBLINGUAL_TABLET | SUBLINGUAL | 1 refills | Status: AC | PRN

## 2023-09-03 NOTE — Telephone Encounter (Signed)
*  STAT* If patient is at the pharmacy, call can be transferred to refill team.   1. Which medications need to be refilled? (please list name of each medication and dose if known)   nitroGLYCERIN (NITROSTAT) 0.4 MG SL tablet    2. Which pharmacy/location (including street and city if local pharmacy) is medication to be sent to?  [facility] - [place], [place] - [address]    3. Do they need a 30 day or 90 day supply? 90

## 2023-09-25 DIAGNOSIS — I129 Hypertensive chronic kidney disease with stage 1 through stage 4 chronic kidney disease, or unspecified chronic kidney disease: Secondary | ICD-10-CM | POA: Diagnosis not present

## 2023-09-25 DIAGNOSIS — Z0000 Encounter for general adult medical examination without abnormal findings: Secondary | ICD-10-CM | POA: Diagnosis not present

## 2023-09-25 DIAGNOSIS — N1831 Chronic kidney disease, stage 3a: Secondary | ICD-10-CM | POA: Diagnosis not present

## 2023-09-25 DIAGNOSIS — I10 Essential (primary) hypertension: Secondary | ICD-10-CM | POA: Diagnosis not present

## 2023-09-25 DIAGNOSIS — K219 Gastro-esophageal reflux disease without esophagitis: Secondary | ICD-10-CM | POA: Diagnosis not present

## 2023-09-25 DIAGNOSIS — E785 Hyperlipidemia, unspecified: Secondary | ICD-10-CM | POA: Diagnosis not present

## 2023-09-25 DIAGNOSIS — Z13820 Encounter for screening for osteoporosis: Secondary | ICD-10-CM | POA: Diagnosis not present

## 2023-10-15 DIAGNOSIS — Z1231 Encounter for screening mammogram for malignant neoplasm of breast: Secondary | ICD-10-CM | POA: Diagnosis not present

## 2023-11-11 DIAGNOSIS — I10 Essential (primary) hypertension: Secondary | ICD-10-CM | POA: Diagnosis not present

## 2023-11-11 DIAGNOSIS — A09 Infectious gastroenteritis and colitis, unspecified: Secondary | ICD-10-CM | POA: Diagnosis not present

## 2023-11-11 DIAGNOSIS — Z6827 Body mass index (BMI) 27.0-27.9, adult: Secondary | ICD-10-CM | POA: Diagnosis not present

## 2023-11-20 DIAGNOSIS — Z780 Asymptomatic menopausal state: Secondary | ICD-10-CM | POA: Diagnosis not present

## 2023-11-20 DIAGNOSIS — M8589 Other specified disorders of bone density and structure, multiple sites: Secondary | ICD-10-CM | POA: Diagnosis not present

## 2023-11-20 DIAGNOSIS — Z13820 Encounter for screening for osteoporosis: Secondary | ICD-10-CM | POA: Diagnosis not present

## 2023-11-21 ENCOUNTER — Other Ambulatory Visit: Payer: Self-pay

## 2023-11-21 DIAGNOSIS — I2510 Atherosclerotic heart disease of native coronary artery without angina pectoris: Secondary | ICD-10-CM

## 2023-11-21 MED ORDER — ATORVASTATIN CALCIUM 40 MG PO TABS: 40.0000 mg | ORAL_TABLET | Freq: Every day | ORAL | 0 refills | Status: DC

## 2023-11-21 MED ORDER — CARVEDILOL 3.125 MG PO TABS: 3.1250 mg | ORAL_TABLET | Freq: Two times a day (BID) | ORAL | 0 refills | Status: DC

## 2023-11-21 NOTE — Addendum Note (Signed)
 Addended by: Margaret Pyle D on: 11/21/2023 03:26 PM   Modules accepted: Orders

## 2023-12-17 NOTE — Progress Notes (Signed)
 Cardiology Office Note:  .   Date:  12/25/2023  ID:  Amanda Dawson, DOB [birth year], MRN [id number] PCP: No primary care provider on file.  [facility] Providers Cardiologist:  Little Ishikawa, MD Cardiology APP:  Dyann Kief, PA-C    History of Present Illness: .   Amanda Dawson is a 68 y.o. female  with history of NSTEMI 06/2016 treated with stenting to the mid LAD, stent to the OM1 and stent to the mid RCA.  LVEF 35 to 45% on ventriculogram, repeat echo 09/2016 EF 55 to 60%.  She was in the twilight study on aspirin and Brilinta and hormone replacement was discontinued.   Patient comes in for yearly f/u. Denies chest pain, dyspnea, palpitations, edema. Walking 20-30 min a day. Having to take care of her husband who had several hospitalizations.   ROS:    Studies Reviewed: Marland Kitchen    EKG Interpretation Date/Time:  Tuesday December 25 2023 09:07:05 EDT Ventricular Rate:  70 PR Interval:  212 QRS Duration:  80 QT Interval:  404 QTC Calculation: 436 R Axis:   67  Text Interpretation: Sinus rhythm with 1st degree A-V block When compared with ECG of 12/2022 1st degree A-V block present Confirmed by Jacolyn Reedy [phone]) on 12/25/2023 9:09:34 AM    Prior CV Studies:    2D echo 10/02/2016 Study Conclusions   - Left ventricle: The cavity size was normal. Systolic function was    normal. The estimated ejection fraction was in the range of 55%    to 60%. Wall motion was normal; there were no regional wall    motion abnormalities. Left ventricular diastolic function    parameters were normal.  - Atrial septum: No defect or patent foramen ovale was identified.  - Impressions: GLS abnormal but does not appear to have tracked    well.   Impressions:   - GLS abnormal but does not appear to have tracked well.    Cardiac catheterization 07/10/2016 The left ventricular ejection fraction is 35-45% by visual estimate. There is mild to moderate left ventricular systolic  dysfunction. LV end diastolic pressure is normal. There is no aortic valve stenosis. Mid LAD lesion, 99 %stenosed. A STENT PROMUS PREM MR 2.5X38 drug eluting stent was successfully placed, postdilated to 2.8 mm. Post intervention, there is a 0% residual stenosis. 1st Mrg lesion, 80 %stenosed. A STENT PROMUS PREM MR 2.75X12 drug eluting stent was successfully placed, postdilated to 3.0 mm. Post intervention, there is a 0% residual stenosis. Mid RCA lesion, 95 %stenosed. A STENT PROMUS PREM MR 2.5X32 drug eluting stent was successfully placed, postdilated to > 3 mm in diameter. Post intervention, there is a 0% residual stenosis.   Continue dual antiplatelet therapy along with aggressive secondary prevention.  I stressed the importance of dual antiplatelet therapy.  She will need a repeat echo in 6- 8 weeks to see that her LVEF improved.  Add ACE-I and continue beta blocker for LV dysfunction.         Risk Assessment/Calculations:             Physical Exam:   VS:  BP 134/72 (BP Location: Left Arm, Patient Position: Sitting, Cuff Size: Normal)   Pulse 75   Resp 16   Ht 5\' 1"  (1.549 m)   Wt 143 lb 9.6 oz (65.1 kg)   SpO2 98%   BMI 27.13 kg/m    Wt Readings from Last 3 Encounters:  12/25/23 143 lb 9.6 oz (65.1  kg)  12/19/22 154 lb 6.4 oz (70 kg)  12/13/21 157 lb (71.2 kg)    GEN: Well nourished, well developed in no acute distress NECK: No JVD; No carotid bruits CARDIAC: RRR, no murmurs, rubs, gallops RESPIRATORY:  Clear to auscultation without rales, wheezing or rhonchi  ABDOMEN: Soft, non-tender, non-distended EXTREMITIES:  No edema; No deformity   ASSESSMENT AND PLAN: .   CAD status post NSTEMI 2017 treated with DES to the mid LAD, DES to OM1, DES to the mid RCA on Plavix and aspirin. Labs reviewed from [facility] in care everywhere and stable. No bleeding on plavix and ASA   Ischemic cardiomyopathy ejection fraction 35 to 40% improved to 55 to 60% on echo 09/2016-no CHF  symptoms   Hypertension BP well controlled   Hyperlipidemia   on lipitor 40 mg daily  need FLP-will order        Dispo: f/u in 1 yr.  Signed, Jacolyn Reedy, PA-C

## 2023-12-25 ENCOUNTER — Ambulatory Visit: Payer: Medicare Other | Attending: Cardiology | Admitting: Physician Assistant

## 2023-12-25 ENCOUNTER — Encounter: Payer: Self-pay | Admitting: Physician Assistant

## 2023-12-25 VITALS — BP 134/72 | HR 75 | Resp 16 | Ht 61.0 in | Wt 143.6 lb

## 2023-12-25 DIAGNOSIS — I255 Ischemic cardiomyopathy: Secondary | ICD-10-CM

## 2023-12-25 DIAGNOSIS — E785 Hyperlipidemia, unspecified: Secondary | ICD-10-CM | POA: Diagnosis not present

## 2023-12-25 DIAGNOSIS — I10 Essential (primary) hypertension: Secondary | ICD-10-CM | POA: Diagnosis not present

## 2023-12-25 DIAGNOSIS — I2510 Atherosclerotic heart disease of native coronary artery without angina pectoris: Secondary | ICD-10-CM | POA: Diagnosis not present

## 2023-12-25 NOTE — Patient Instructions (Addendum)
 Medication Instructions:  Your physician recommends that you continue on your current medications as directed. Please refer to the Current Medication list given to you today.  *If you need a refill on your cardiac medications before your next appointment, please call your pharmacy*  Lab Work: FASTING LIPIDS  If you have labs (blood work) drawn today and your tests are completely normal, you will receive your results only by: MyChart Message (if you have MyChart) OR A paper copy in the mail If you have any lab test that is abnormal or we need to change your treatment, we will call you to review the results.  Testing/Procedures: NONE   Follow-Up: At [facility], you and your health needs are our priority.  As part of our continuing mission to provide you with exceptional heart care, our providers are all part of one team.  This team includes your primary Cardiologist (physician) and Advanced Practice Providers or APPs (Physician Assistants and Nurse Practitioners) who all work together to provide you with the care you need, when you need it.  Your next appointment:   1 year(s)  Provider:   DR. Bjorn Pippin  We recommend signing up for the patient portal called "MyChart".  Sign up information is provided on this After Visit Summary.  MyChart is used to connect with patients for Virtual Visits (Telemedicine).  Patients are able to view lab/test results, encounter notes, upcoming appointments, etc.  Non-urgent messages can be sent to your provider as well.   To learn more about what you can do with MyChart, go to [email or web address].   Other Instructions YOUR PROVIDER RECOMMENDS THAT YOU DO AT LEAST 150 MINUTES OF EXERCISE A WEEK.         1st Floor: - Lobby - Registration  - Pharmacy  - Lab - Cafe  2nd Floor: - PV Lab - Diagnostic Testing (echo, CT, nuclear med)  3rd Floor: - Vacant  4th Floor: - TCTS (cardiothoracic surgery) - AFib Clinic - Structural Heart  Clinic - Vascular Surgery  - Vascular Ultrasound  5th Floor: - HeartCare Cardiology (general and EP) - Clinical Pharmacy for coumadin, hypertension, lipid, weight-loss medications, and med management appointments    Valet parking services will be available as well.

## 2023-12-26 DIAGNOSIS — I255 Ischemic cardiomyopathy: Secondary | ICD-10-CM | POA: Diagnosis not present

## 2023-12-26 DIAGNOSIS — I2510 Atherosclerotic heart disease of native coronary artery without angina pectoris: Secondary | ICD-10-CM | POA: Diagnosis not present

## 2023-12-26 DIAGNOSIS — E785 Hyperlipidemia, unspecified: Secondary | ICD-10-CM | POA: Diagnosis not present

## 2023-12-26 DIAGNOSIS — I10 Essential (primary) hypertension: Secondary | ICD-10-CM | POA: Diagnosis not present

## 2023-12-27 LAB — LIPID PANEL
Chol/HDL Ratio: 2.4 ratio (ref 0.0–4.4)
Cholesterol, Total: 122 mg/dL (ref 100–199)
HDL: 50 mg/dL (ref >39)
LDL Chol Calc (NIH): 50 mg/dL (ref 0–99)
Triglycerides: 122 mg/dL (ref 0–149)
VLDL Cholesterol Cal: 22 mg/dL (ref 5–40)

## 2024-01-11 ENCOUNTER — Other Ambulatory Visit: Payer: Self-pay | Admitting: Physician Assistant

## 2024-02-16 ENCOUNTER — Other Ambulatory Visit: Payer: Self-pay | Admitting: Physician Assistant

## 2024-02-16 DIAGNOSIS — I2510 Atherosclerotic heart disease of native coronary artery without angina pectoris: Secondary | ICD-10-CM

## 2024-02-17 ENCOUNTER — Other Ambulatory Visit: Payer: Self-pay | Admitting: Physician Assistant

## 2024-02-17 DIAGNOSIS — I2510 Atherosclerotic heart disease of native coronary artery without angina pectoris: Secondary | ICD-10-CM

## 2024-03-04 DIAGNOSIS — K080 Exfoliation of teeth due to systemic causes: Secondary | ICD-10-CM | POA: Diagnosis not present

## 2024-03-18 DIAGNOSIS — I2510 Atherosclerotic heart disease of native coronary artery without angina pectoris: Secondary | ICD-10-CM

## 2024-03-18 MED ORDER — CARVEDILOL 3.125 MG PO TABS: 3.1250 mg | ORAL_TABLET | Freq: Two times a day (BID) | ORAL | 3 refills | Status: AC

## 2024-03-18 NOTE — Telephone Encounter (Signed)
 Pt seen 12/25/23 by Rosaline Pavy with Carvedilol  3.125mg  BID on medication list and no changes made. Advised to f/u in 1 year. Refills sent to pharmacy as requested.

## 2024-03-26 DIAGNOSIS — L820 Inflamed seborrheic keratosis: Secondary | ICD-10-CM | POA: Diagnosis not present

## 2024-03-26 DIAGNOSIS — L570 Actinic keratosis: Secondary | ICD-10-CM | POA: Diagnosis not present

## 2024-03-26 DIAGNOSIS — L821 Other seborrheic keratosis: Secondary | ICD-10-CM | POA: Diagnosis not present

## 2024-03-26 DIAGNOSIS — L578 Other skin changes due to chronic exposure to nonionizing radiation: Secondary | ICD-10-CM | POA: Diagnosis not present

## 2024-03-26 DIAGNOSIS — D2239 Melanocytic nevi of other parts of face: Secondary | ICD-10-CM | POA: Diagnosis not present

## 2024-06-03 DIAGNOSIS — Z01419 Encounter for gynecological examination (general) (routine) without abnormal findings: Secondary | ICD-10-CM | POA: Diagnosis not present

## 2024-06-03 DIAGNOSIS — Z1331 Encounter for screening for depression: Secondary | ICD-10-CM | POA: Diagnosis not present

## 2024-06-16 ENCOUNTER — Other Ambulatory Visit: Payer: Self-pay | Admitting: Medical Genetics

## 2024-06-20 ENCOUNTER — Other Ambulatory Visit (HOSPITAL_COMMUNITY)

## 2024-09-09 DIAGNOSIS — H5210 Myopia, unspecified eye: Secondary | ICD-10-CM | POA: Diagnosis not present

## 2024-09-16 ENCOUNTER — Other Ambulatory Visit (HOSPITAL_COMMUNITY)
Admission: RE | Admit: 2024-09-16 | Discharge: 2024-09-16 | Disposition: A | Discharge status: Home / Self Care | Payer: Self-pay | Source: Ambulatory Visit | Attending: Medical Genetics | Admitting: Medical Genetics

## 2024-09-28 LAB — GENECONNECT MOLECULAR SCREEN: Genetic Analysis Overall Interpretation: NEGATIVE
